# Patient Record
Sex: Male | Born: 1955 | Race: White | Hispanic: No | Marital: Married | Smoking: Former smoker
Health system: Southern US, Community
[De-identification: ages and names within clinical notes are randomized; demographics above are authoritative.]

## PROBLEM LIST (undated history)

## (undated) DIAGNOSIS — C760 Malignant neoplasm of head, face and neck: Secondary | ICD-10-CM

## (undated) DIAGNOSIS — G9520 Unspecified cord compression: Secondary | ICD-10-CM

## (undated) DIAGNOSIS — R1310 Dysphagia, unspecified: Secondary | ICD-10-CM

## (undated) DIAGNOSIS — F1010 Alcohol abuse, uncomplicated: Secondary | ICD-10-CM

## (undated) DIAGNOSIS — J439 Emphysema, unspecified: Secondary | ICD-10-CM

## (undated) DIAGNOSIS — Z87891 Personal history of nicotine dependence: Secondary | ICD-10-CM

## (undated) DIAGNOSIS — E039 Hypothyroidism, unspecified: Secondary | ICD-10-CM

## (undated) DIAGNOSIS — D649 Anemia, unspecified: Secondary | ICD-10-CM

## (undated) DIAGNOSIS — N400 Enlarged prostate without lower urinary tract symptoms: Secondary | ICD-10-CM

## (undated) DIAGNOSIS — G4733 Obstructive sleep apnea (adult) (pediatric): Secondary | ICD-10-CM

## (undated) DIAGNOSIS — N3941 Urge incontinence: Secondary | ICD-10-CM

## (undated) DIAGNOSIS — C7951 Secondary malignant neoplasm of bone: Secondary | ICD-10-CM

## (undated) DIAGNOSIS — C7800 Secondary malignant neoplasm of unspecified lung: Secondary | ICD-10-CM

## (undated) DIAGNOSIS — R451 Restlessness and agitation: Secondary | ICD-10-CM

## (undated) DIAGNOSIS — E871 Hypo-osmolality and hyponatremia: Secondary | ICD-10-CM

## (undated) DIAGNOSIS — I621 Nontraumatic extradural hemorrhage: Secondary | ICD-10-CM

## (undated) DIAGNOSIS — K219 Gastro-esophageal reflux disease without esophagitis: Secondary | ICD-10-CM

## (undated) DIAGNOSIS — I82629 Acute embolism and thrombosis of deep veins of unspecified upper extremity: Secondary | ICD-10-CM

## (undated) DIAGNOSIS — I10 Essential (primary) hypertension: Secondary | ICD-10-CM

## (undated) DIAGNOSIS — H9190 Unspecified hearing loss, unspecified ear: Secondary | ICD-10-CM

## (undated) DIAGNOSIS — Z515 Encounter for palliative care: Secondary | ICD-10-CM

## (undated) HISTORY — DX: Secondary malignant neoplasm of unspecified lung: C78.00

## (undated) HISTORY — DX: Urge incontinence: N39.41

## (undated) HISTORY — DX: Benign prostatic hyperplasia without lower urinary tract symptoms: N40.0

## (undated) HISTORY — DX: Essential (primary) hypertension: I10

## (undated) HISTORY — PX: OTHER SURGICAL HISTORY: SHX169

## (undated) HISTORY — DX: Malignant neoplasm of head, face and neck: C76.0

## (undated) HISTORY — DX: Anemia, unspecified: D64.9

## (undated) HISTORY — DX: Restlessness and agitation: R45.1

## (undated) HISTORY — DX: Hypo-osmolality and hyponatremia: E87.1

## (undated) HISTORY — DX: Personal history of nicotine dependence: Z87.891

## (undated) HISTORY — DX: Gastro-esophageal reflux disease without esophagitis: K21.9

## (undated) HISTORY — DX: Dysphagia, unspecified: R13.10

## (undated) HISTORY — PX: LUNG BIOPSY: SHX232

## (undated) HISTORY — DX: Hypothyroidism, unspecified: E03.9

## (undated) HISTORY — DX: Alcohol abuse, uncomplicated: F10.10

## (undated) HISTORY — DX: Encounter for palliative care: Z51.5

## (undated) HISTORY — DX: Emphysema, unspecified: J43.9

## (undated) HISTORY — DX: Unspecified hearing loss, unspecified ear: H91.90

## (undated) HISTORY — DX: Obstructive sleep apnea (adult) (pediatric): G47.33

---

## 2006-08-19 HISTORY — PX: OTHER SURGICAL HISTORY: SHX169

## 2009-08-19 DIAGNOSIS — C4442 Squamous cell carcinoma of skin of scalp and neck: Secondary | ICD-10-CM

## 2009-08-19 DIAGNOSIS — C760 Malignant neoplasm of head, face and neck: Secondary | ICD-10-CM

## 2009-08-19 HISTORY — DX: Malignant neoplasm of head, face and neck: C76.0

## 2009-08-19 HISTORY — PX: THYROIDECTOMY, PARTIAL: SHX18

## 2009-08-19 HISTORY — PX: MODIFIED RADICAL NECK DISSECTION: SHX2045

## 2009-08-19 HISTORY — DX: Squamous cell carcinoma of skin of scalp and neck: C44.42

## 2011-08-20 DIAGNOSIS — J439 Emphysema, unspecified: Secondary | ICD-10-CM

## 2011-08-20 HISTORY — DX: Emphysema, unspecified: J43.9

## 2012-06-04 ENCOUNTER — Telehealth: Payer: Self-pay | Admitting: Oncology

## 2012-06-04 NOTE — Telephone Encounter (Signed)
S/W pt in re NP appt 10/21 @ 10:30 w/Dr. [name]. Referring-[facility] Dx-Head/Neck Emailed:[email or web address]

## 2012-06-04 NOTE — Telephone Encounter (Signed)
C/D 06/04/12 for appt 06/08/12

## 2012-06-05 ENCOUNTER — Encounter: Payer: Self-pay | Admitting: Oncology

## 2012-06-05 DIAGNOSIS — R1310 Dysphagia, unspecified: Secondary | ICD-10-CM | POA: Insufficient documentation

## 2012-06-05 DIAGNOSIS — H9190 Unspecified hearing loss, unspecified ear: Secondary | ICD-10-CM | POA: Insufficient documentation

## 2012-06-05 DIAGNOSIS — C4442 Squamous cell carcinoma of skin of scalp and neck: Secondary | ICD-10-CM | POA: Insufficient documentation

## 2012-06-05 DIAGNOSIS — C760 Malignant neoplasm of head, face and neck: Secondary | ICD-10-CM | POA: Insufficient documentation

## 2012-06-07 NOTE — Patient Instructions (Addendum)
1.  Metastatic head/neck cancer with lung mets. 2.  Treatments:  Extensive chemo in the past. 3.  Plan:  Chemo holiday to recover from past treatments.  Repeat CT scan in Nov 2013 to reassess disease status. I will see you the day after the scan to discuss what to do. 4.  Primary care physician: I will arrange for to see Dr. Baldo Ash [facility] primary care service to be seen within one month.

## 2012-06-08 ENCOUNTER — Telehealth: Payer: Self-pay | Admitting: Oncology

## 2012-06-08 ENCOUNTER — Other Ambulatory Visit (HOSPITAL_BASED_OUTPATIENT_CLINIC_OR_DEPARTMENT_OTHER): Payer: Federal, State, Local not specified - PPO | Admitting: Lab

## 2012-06-08 ENCOUNTER — Encounter: Payer: Self-pay | Admitting: Oncology

## 2012-06-08 ENCOUNTER — Ambulatory Visit: Payer: Federal, State, Local not specified - PPO

## 2012-06-08 ENCOUNTER — Ambulatory Visit (HOSPITAL_BASED_OUTPATIENT_CLINIC_OR_DEPARTMENT_OTHER): Payer: Federal, State, Local not specified - PPO | Admitting: Oncology

## 2012-06-08 VITALS — BP 137/86 | HR 82 | Temp 97.5°F | Resp 20 | Ht 69.5 in | Wt 157.9 lb

## 2012-06-08 DIAGNOSIS — C760 Malignant neoplasm of head, face and neck: Secondary | ICD-10-CM

## 2012-06-08 DIAGNOSIS — C7800 Secondary malignant neoplasm of unspecified lung: Secondary | ICD-10-CM

## 2012-06-08 DIAGNOSIS — C801 Malignant (primary) neoplasm, unspecified: Secondary | ICD-10-CM

## 2012-06-08 DIAGNOSIS — N400 Enlarged prostate without lower urinary tract symptoms: Secondary | ICD-10-CM

## 2012-06-08 DIAGNOSIS — Z23 Encounter for immunization: Secondary | ICD-10-CM

## 2012-06-08 DIAGNOSIS — F411 Generalized anxiety disorder: Secondary | ICD-10-CM

## 2012-06-08 DIAGNOSIS — H9190 Unspecified hearing loss, unspecified ear: Secondary | ICD-10-CM

## 2012-06-08 DIAGNOSIS — R4702 Dysphasia: Secondary | ICD-10-CM

## 2012-06-08 LAB — COMPREHENSIVE METABOLIC PANEL (CC13)
AST: 18 U/L (ref 5–34)
Alkaline Phosphatase: 48 U/L (ref 40–150)
Glucose: 74 mg/dl (ref 70–99)
Sodium: 129 mEq/L — ABNORMAL LOW (ref 136–145)
Total Bilirubin: 0.7 mg/dL (ref 0.20–1.20)
Total Protein: 7 g/dL (ref 6.4–8.3)

## 2012-06-08 LAB — CBC WITH DIFFERENTIAL/PLATELET
Basophils Absolute: 0 10*3/uL (ref 0.0–0.1)
Eosinophils Absolute: 0.1 10*3/uL (ref 0.0–0.5)
LYMPH%: 9.7 % — ABNORMAL LOW (ref 14.0–49.0)
MCV: 94.9 fL (ref 79.3–98.0)
MONO%: 10.9 % (ref 0.0–14.0)
NEUT#: 2.9 10*3/uL (ref 1.5–6.5)
Platelets: 141 10*3/uL (ref 140–400)
RBC: 3.57 10*6/uL — ABNORMAL LOW (ref 4.20–5.82)

## 2012-06-08 MED ORDER — INFLUENZA VIRUS VACC SPLIT PF IM SUSP
0.5000 mL | Freq: Once | INTRAMUSCULAR | Status: AC
  Administered 2012-06-08: 0.5 mL via INTRAMUSCULAR
  Filled 2012-06-08: qty 0.5

## 2012-06-08 MED ORDER — TAMSULOSIN HCL 0.4 MG PO CAPS: 0.4000 mg | ORAL_CAPSULE | Freq: Every day | ORAL | Status: DC

## 2012-06-08 MED ORDER — SOLIFENACIN SUCCINATE 5 MG PO TABS: 5.0000 mg | ORAL_TABLET | Freq: Every day | ORAL | Status: DC

## 2012-06-08 NOTE — Telephone Encounter (Signed)
Printed and gv pt appt schedule for NOV.Marland Kitchenthe patient stated that Dr. [name] sent information to Dr. Oren Section at [place] [facility] so that he could become established as a pt..Also gv pt their telephone number so they could also call to make an appointment to become established as a pt

## 2012-06-08 NOTE — Progress Notes (Signed)
Checked in new pt with no financial concerns. °

## 2012-06-08 NOTE — Progress Notes (Signed)
[facility]  [phone] [phone]   MEDICAL ONCOLOGY - INITIAL CONSULATION    Referral MD:  Dr. Susanne Borders, M.D.   Reason for Referral: metastatic head/neck cancer.   HPI:  [name] [name] is a 55 year-old man with history of smoking, chewing tobacco, drinking EtOH.  He quit smoking and chewing tobacco.  However, he still drinks.  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  He had neck dissection and adjuvant chemorad.  He developed metastatic disease in Oct 2012 with lung mets per biopsy.  He received carboplatin/5FU/Erbitux with toxicity which was d/c in 12/2011.  He was started on weekly Taxol (3 weeks on, 1 week off).  However, he was admitted to the hospital in the hospital on 04/08/2012 with fall, head laceration, anemia, and hyponatremia.  He was under the care of Dr. Juliette Alcide, in [place] at [facility].  He was in the process of moving to [place] to be with his son.  Dr. Juliette Alcide recommended chemo holiday for him to recover.  He was kindly referred to the Cancer Center to establish care.   [name] [name] presented to the clinic to day for the first time with his wife.  Being off of chemo since August 2013, he has felt better.  However, he lost weight during the admission in August and has not regained the lost weight. He still drinks up to 12 bottles beer a day.  He has mild fatigue; however, he is independent of light activities of daily living.  He spends a lot of time at rest.  He was able to tolerate the long car ride from [place] to [place] a few weeks ago.  He has fibrotic skin change in the left neck since radiation.  He does not restricted range of motion.  He has mild dysphagia with dry foods but drinking fluid resolves the dysphagia temporarily.  He denied palpable node swelling, fever, headache, mucositis, cough, SOB, chest pain, abd pain, bleeding symptoms, back pain, bowel/bladder  incontinence, depression, hallucination.     Past Medical History  Diagnosis Date  . Squamous cell carcinoma of head and neck 2011    Unknown primary. He presented with right sided ear pain. Biopsy in the neck nodes showed squamous cell carcinoma. He underwent neck dissection then chemoradiation. He developed lung nodules in August 2012. He received carboplatin, 5-FU, cetuximab which in October 2012 until may 2013 with multiple dose delays and reductions because of toxicities. He had disease progression in the lungs in may 2013.  Marland Kitchen Hearing loss   . Hypothyroid   . Hypertension   . Gastroesophageal reflux disease   . Obstructive sleep apnea     not using CPAP  . Anemia   . Dysphasia   . BPH (benign prostatic hyperplasia)   . Restlessness and agitation     unclear etiology.  His previous physicians gave him  Risperdal.   :  Past Surgical History  Procedure Date  . Modified radical neck dissection     left side.   . Thyroidectomy, partial   . Lung biopsy   :  Current Outpatient Prescriptions  Medication Sig Dispense Refill  . Albuterol Sulfate (PROAIR HFA IN) Inhale 8.5 g into the lungs as needed.      Marland Kitchen aspirin 81 MG tablet Take 81 mg by mouth daily.      Marland Kitchen escitalopram (LEXAPRO) 20 MG tablet Take 20 mg by mouth daily.      Marland Kitchen  Magnesium 100 MG TABS Take 100 mg by mouth daily.      Marland Kitchen omeprazole (PRILOSEC) 40 MG capsule Take 40 mg by mouth daily.      . ondansetron (ZOFRAN) 8 MG tablet Take 8 mg by mouth every 8 (eight) hours as needed.      . prochlorperazine (COMPAZINE) 10 MG tablet Take 10 mg by mouth every 6 (six) hours as needed.      . risperiDONE (RISPERDAL) 0.25 MG tablet Take 0.25 mg by mouth daily.      . Tamsulosin HCl (FLOMAX) 0.4 MG CAPS Take 1 capsule (0.4 mg total) by mouth daily.  90 capsule  0  . valsartan (DIOVAN) 80 MG tablet Take 80 mg by mouth daily.      . solifenacin (VESICARE) 5 MG tablet Take 1 tablet (5 mg total) by mouth daily.  90 tablet  0   Current  Facility-Administered Medications  Medication Dose Route Frequency Provider Last Rate Last Dose  . influenza  inactive virus vaccine (FLUZONE/FLUARIX) injection 0.5 mL  0.5 mL Intramuscular Once Exie Parody, MD   0.5 mL at 06/08/12 1159     No Known Allergies:  Family History  Problem Relation Age of Onset  . Cancer Father     Melanoma  . Cancer Maternal Grandfather     colon  :  History   Social History  . Marital Status: Married    Spouse Name: N/A    Number of Children: 1  . Years of Education: N/A   Occupational History  .      retired Chiropodist    Social History Main Topics  . Smoking status: Former Smoker -- 0.5 packs/day    Quit date: 08/19/2001  . Smokeless tobacco: Former Neurosurgeon    Quit date: 08/19/2001  . Alcohol Use: No  . Drug Use: No  . Sexually Active:    Other Topics Concern  . Not on file   Social History Narrative  . No narrative on file  :  Pertinent items are noted in HPI.  Exam: ECOG 2.   General:  Thin-appearing man, in no acute distress.  Eyes:  no scleral icterus.  ENT:  There were no oropharyngeal lesions.  Neck was without thyromegaly.  His left neck was fibrotic but without palpable mass.  Lymphatics:  Negative cervical, supraclavicular or axillary adenopathy.  Respiratory: lungs were clear bilaterally without wheezing or crackles.  Cardiovascular:  Regular rate and rhythm, S1/S2, without murmur, rub or gallop.  There was no pedal edema.  GI:  abdomen was soft, flat, nontender, nondistended, without organomegaly.  Muscoloskeletal:  no spinal tenderness of palpation of vertebral spine.  Skin exam was without echymosis, petichae.  Neuro exam was nonfocal.  Patient was able to get on and off exam table without assistance.  Gait was normal.  Patient was alerted and oriented.  Attention was good.   Language was appropriate.  Mood was normal without depression.  Speech was not pressured.  Thought content was not tangential.     Lab Results    Component Value Date   WBC 3.7* 06/08/2012   HGB 11.5* 06/08/2012   HCT 33.9* 06/08/2012   PLT 141 06/08/2012   GLUCOSE 74 06/08/2012   ALT 14 06/08/2012   AST 18 06/08/2012   NA 129* 06/08/2012   K 5.4* 06/08/2012   CL 93* 06/08/2012   CREATININE 0.8 06/08/2012   BUN 12.0 06/08/2012   CO2 29 06/08/2012  Assessment and Plan:   1.  History of smoking and chewing tobacco:  I stressed the importance of stopping this given his cancer.   2.  Current EtOH:  Up to 12 packs/day.  This is empty calories, causing him to not improved his strength, and hyponatremia.  I strongly advised him to slowly wean off to avoid DT and seizure.  3.  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  I advised him to continue chemo holiday until Nov 2013 when we will obtain another restaging scan.  If he has significant disease progression, I may consider starting Carbo/Taxol/Erbitux.  If there is no disease progression, I may recommend continue chemo holiday.  4.  BPH:  He has been out of Tamsulosin and Vesicare for about 2 weeks.  He asked for refill.  I gave him 58-month supply but strongly recommended that he establish primary care.  He asked for recommendation.  He lives in [place].  I referred him to Dr. Sharen Hones from [facility].  5.  Depression/anxiety:  On Lexapro and Risperdal.  6.  Follow up:  After restaging CT scan in Nov 2013.    Thank you for this referral.   The length of time of the face-to-face encounter was 45 minutes. More than 50% of time was spent counseling and coordination of care.

## 2012-06-22 ENCOUNTER — Telehealth: Payer: Self-pay | Admitting: Family Medicine

## 2012-06-23 NOTE — Telephone Encounter (Addendum)
We can place 07/06/2012 at 2pm, 45 min appt plz. Let me know if he needs refills prior.

## 2012-06-23 NOTE — Telephone Encounter (Signed)
I spoke with patient and he scheduled appointment on 07/06/12.  Patient said he has enough medication until the appointment.

## 2012-07-06 ENCOUNTER — Other Ambulatory Visit (HOSPITAL_COMMUNITY): Payer: Federal, State, Local not specified - PPO

## 2012-07-06 ENCOUNTER — Encounter: Payer: Self-pay | Admitting: Family Medicine

## 2012-07-06 ENCOUNTER — Ambulatory Visit (INDEPENDENT_AMBULATORY_CARE_PROVIDER_SITE_OTHER): Payer: Federal, State, Local not specified - PPO | Admitting: Family Medicine

## 2012-07-06 VITALS — BP 156/90 | HR 80 | Temp 98.5°F | Ht 71.0 in | Wt 170.5 lb

## 2012-07-06 DIAGNOSIS — R1310 Dysphagia, unspecified: Secondary | ICD-10-CM

## 2012-07-06 DIAGNOSIS — F1010 Alcohol abuse, uncomplicated: Secondary | ICD-10-CM

## 2012-07-06 DIAGNOSIS — N400 Enlarged prostate without lower urinary tract symptoms: Secondary | ICD-10-CM

## 2012-07-06 DIAGNOSIS — G4733 Obstructive sleep apnea (adult) (pediatric): Secondary | ICD-10-CM

## 2012-07-06 DIAGNOSIS — E039 Hypothyroidism, unspecified: Secondary | ICD-10-CM

## 2012-07-06 DIAGNOSIS — C4442 Squamous cell carcinoma of skin of scalp and neck: Secondary | ICD-10-CM

## 2012-07-06 DIAGNOSIS — I10 Essential (primary) hypertension: Secondary | ICD-10-CM

## 2012-07-06 DIAGNOSIS — K219 Gastro-esophageal reflux disease without esophagitis: Secondary | ICD-10-CM

## 2012-07-06 DIAGNOSIS — C760 Malignant neoplasm of head, face and neck: Secondary | ICD-10-CM

## 2012-07-06 DIAGNOSIS — H9190 Unspecified hearing loss, unspecified ear: Secondary | ICD-10-CM

## 2012-07-06 DIAGNOSIS — Z87891 Personal history of nicotine dependence: Secondary | ICD-10-CM | POA: Insufficient documentation

## 2012-07-06 LAB — BASIC METABOLIC PANEL
BUN: 15 mg/dL (ref 6–23)
CO2: 29 mEq/L (ref 19–32)
Calcium: 9.2 mg/dL (ref 8.4–10.5)
GFR: 101.51 mL/min (ref >60.00)
Glucose, Bld: 95 mg/dL (ref 70–99)
Potassium: 5.3 mEq/L — ABNORMAL HIGH (ref 3.5–5.1)

## 2012-07-06 LAB — MAGNESIUM: Magnesium: 2.1 mg/dL (ref 1.5–2.5)

## 2012-07-06 NOTE — Assessment & Plan Note (Signed)
Previously on levothyroxine daily, this was not refilled.  Will check TSH today to see if continued need. H/o partial thyroidectomy.

## 2012-07-06 NOTE — Assessment & Plan Note (Addendum)
H/o squamous cell cancer of head and neck, seemed in remission. Concern for new lesions in R lung, pending CT scan tomorrow. To follow with Dr. [name] this week. Await records from PCP.

## 2012-07-06 NOTE — Assessment & Plan Note (Signed)
Not using CPAP.  

## 2012-07-06 NOTE — Assessment & Plan Note (Signed)
Controlled on omeprazole, continue. 

## 2012-07-06 NOTE — Patient Instructions (Addendum)
Keep track of blood pressure at home.  If consistently >140/90, let me know. Let's check some blood work today - electrolytes, magnesium, and thyroid function. Good to see you today, call us with questions.

## 2012-07-06 NOTE — Assessment & Plan Note (Signed)
Strongly encouraged slow titration off EtOH.  Pt states will attempt.

## 2012-07-06 NOTE — Assessment & Plan Note (Signed)
On vesicare (h/o urinary incontinence) and flomax

## 2012-07-06 NOTE — Progress Notes (Signed)
Subjective:    Patient ID: [name] [name], male    DOB: 05-04-1956, 56 y.o.   MRN: [id number]  HPI CC: New pt to establish  Moved here September to be closer to grandchildren.  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  Unknown origin.  Completed chemo and radiation therapy 12/2009.  In remission until 04/2011 (nodules in lung found) s/p biopsy positive for squamous cell cancer.  After this had another 46 weeks of chemo.  Last chemo therapy was 03/2012.  Next appointment with Dr. [name] is this Wednesday.  CT scan scheduled for tomorrow.  Dysphagia - tends to cough and aspirate.  More trouble with large bites.  S/p XRT damage.  Trouble with solid foods.  No problems with liquids per patient.  No h/o aspiration PNA.  HTN - well controlled on diovan.  BPH - on flomax and vesicare (h/o urinary incontinence).  Still with intermittent accidents.  Wears pad.  Depression - lexapro has significantly helped.  On risperdal to enhance escitalopram.  Saw psychiatrist in [place], none locally.  Very stable from depression standpoint.  Denies anxiety issues.  EtOH - h/o heavy liquor use.  Wife stopped this.  Now drinking 3-12 beers/day.  No hangover.  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  Preventative: Flu 2013 Tetanus 2009 pneumonia shot 2013  Wt Readings from Last 3 Encounters:  07/06/12 170 lb 8 oz (77.338 kg)  06/08/12 157 lb 14.4 oz (71.623 kg)   Medications and allergies reviewed and updated in chart.  Past histories reviewed and updated if relevant as below. Patient Active Problem List  Diagnosis  . Hearing loss  . Squamous cell carcinoma of head and neck  . Dysphasia  . Hypothyroid  . Hypertension  . Gastroesophageal reflux disease  . Obstructive sleep apnea  . BPH (benign prostatic hyperplasia)   Past  Medical History  Diagnosis Date  . Squamous cell carcinoma of head and neck 2011    Unknown primary. He presented with right sided ear pain. Biopsy in the neck nodes showed squamous cell carcinoma. He underwent neck dissection then chemoradiation. He developed lung nodules in August 2012. He received carboplatin, 5-FU, cetuximab which in October 2012 until may 2013 with multiple dose delays and reductions because of toxicities. He had disease progression in the lungs in may 2013.  Marland Kitchen Hearing loss     wears aides  . Hypothyroid   . Hypertension   . Gastroesophageal reflux disease     well controlled on omeprazole  . Obstructive sleep apnea     not using CPAP  . Anemia   . Dysphasia   . BPH (benign prostatic hyperplasia)   . Restlessness and agitation     unclear etiology.  His previous physicians gave him  Risperdal.   . Urine incontinence    Past Surgical History  Procedure Date  . Modified radical neck dissection 2011    left side.   . Thyroidectomy, partial 2011  . Lung biopsy   . Other surgical history 2008, 2009    L femur rod  . Other surgical history 2008    R radius   History  Substance Use Topics  . Smoking status: Former Smoker -- 0.5 packs/day    Quit date: 08/19/2001  . Smokeless tobacco: Former Neurosurgeon    Quit date: 08/19/2001  . Alcohol Use: Yes  Comment: Regular-beer   Family History  Problem Relation Age of Onset  . Cancer Father     Melanoma  . Cancer Maternal Grandfather     laryngeal  . Hypertension Father   . CAD Neg Hx   . Stroke Neg Hx   . Diabetes Neg Hx    Allergies  Allergen Reactions  . Benadryl (Diphenhydramine Hcl) Anxiety    IV only-oral okay   Current Outpatient Prescriptions on File Prior to Visit  Medication Sig Dispense Refill  . Albuterol Sulfate (PROAIR HFA IN) Inhale 8.5 g into the lungs as needed.      Marland Kitchen aspirin 81 MG tablet Take 81 mg by mouth daily.      Marland Kitchen escitalopram (LEXAPRO) 20 MG tablet Take 20 mg by mouth daily.       Marland Kitchen levothyroxine (SYNTHROID, LEVOTHROID) 50 MCG tablet Take 50 mcg by mouth daily.      Marland Kitchen omeprazole (PRILOSEC) 40 MG capsule Take 40 mg by mouth daily.      . risperiDONE (RISPERDAL) 0.25 MG tablet Take 0.25 mg by mouth daily.      . solifenacin (VESICARE) 5 MG tablet Take 1 tablet (5 mg total) by mouth daily.  90 tablet  0  . Tamsulosin HCl (FLOMAX) 0.4 MG CAPS Take 1 capsule (0.4 mg total) by mouth daily.  90 capsule  0  . valsartan (DIOVAN) 80 MG tablet Take 80 mg by mouth daily.      . ondansetron (ZOFRAN) 8 MG tablet Take 8 mg by mouth every 8 (eight) hours as needed.      . prochlorperazine (COMPAZINE) 10 MG tablet Take 10 mg by mouth every 6 (six) hours as needed.        Review of Systems  Constitutional: Negative for fever, chills, activity change, appetite change, fatigue and unexpected weight change.  HENT: Negative for hearing loss and neck pain.   Eyes: Negative for visual disturbance.  Respiratory: Negative for cough, chest tightness, shortness of breath and wheezing.   Cardiovascular: Negative for chest pain, palpitations and leg swelling.  Gastrointestinal: Negative for nausea, vomiting, abdominal pain, diarrhea, constipation, blood in stool and abdominal distention.  Genitourinary: Negative for hematuria and difficulty urinating.  Musculoskeletal: Negative for myalgias and arthralgias.  Skin: Negative for rash.  Neurological: Negative for dizziness, seizures, syncope and headaches.  Hematological: Bruises/bleeds easily.  Psychiatric/Behavioral: Negative for dysphoric mood. The patient is not nervous/anxious.        Objective:   Physical Exam  Nursing note and vitals reviewed. Constitutional: He is oriented to person, place, and time. He appears well-developed and well-nourished. No distress.       thin  HENT:  Head: Normocephalic and atraumatic.  Right Ear: Tympanic membrane, external ear and ear canal normal. Decreased hearing is noted.  Left Ear: Tympanic  membrane, external ear and ear canal normal. Decreased hearing is noted.  Nose: Nose normal.  Mouth/Throat: Oropharynx is clear and moist. No oropharyngeal exudate.       Some trouble swallowing noted. Stiff/tight L neck from h/o XRT.   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 Hearing aides in place.  Eyes: Conjunctivae normal and EOM are normal. Pupils are equal, round, and reactive to light. No scleral icterus.  Neck: Normal range of motion. Neck supple. No thyromegaly present.  Cardiovascular: Normal rate, regular rhythm, normal heart sounds and intact distal pulses.   No murmur heard. Pulses:      Radial pulses are 2+ on the right side, and 2+ on  the left side.  Pulmonary/Chest: Effort normal and breath sounds normal. No respiratory distress. He has no wheezes. He has no rales.  Abdominal: Soft. Bowel sounds are normal. He exhibits no distension and no mass. There is no tenderness. There is no rebound and no guarding.  Musculoskeletal: Normal range of motion. He exhibits no edema.  Lymphadenopathy:    He has no cervical adenopathy.  Neurological: He is alert and oriented to person, place, and time.       CN grossly intact, station and gait intact  Skin: Skin is warm and dry. No rash noted.  Psychiatric: He has a normal mood and affect. His behavior is normal. Judgment and thought content normal.       Assessment & Plan:

## 2012-07-06 NOTE — Assessment & Plan Note (Signed)
Longstanding.  Wears hearing aides.

## 2012-07-06 NOTE — Assessment & Plan Note (Signed)
Chronic, discussed monitoring bp at home, update me if remaining uncontrolled.

## 2012-07-06 NOTE — Assessment & Plan Note (Signed)
Mild.  Continue to monitor. Wt Readings from Last 3 Encounters:  07/06/12 170 lb 8 oz (77.338 kg)  06/08/12 157 lb 14.4 oz (71.623 kg)

## 2012-07-06 NOTE — Assessment & Plan Note (Signed)
Abstinent since 2003.

## 2012-07-07 ENCOUNTER — Encounter: Payer: Self-pay | Admitting: Family Medicine

## 2012-07-07 ENCOUNTER — Other Ambulatory Visit: Payer: Self-pay | Admitting: Family Medicine

## 2012-07-07 ENCOUNTER — Ambulatory Visit (HOSPITAL_COMMUNITY)
Admission: RE | Admit: 2012-07-07 | Discharge: 2012-07-07 | Disposition: A | Discharge status: Home / Self Care | Payer: Federal, State, Local not specified - PPO | Source: Ambulatory Visit | Attending: Oncology | Admitting: Oncology

## 2012-07-07 DIAGNOSIS — H9190 Unspecified hearing loss, unspecified ear: Secondary | ICD-10-CM | POA: Insufficient documentation

## 2012-07-07 DIAGNOSIS — E039 Hypothyroidism, unspecified: Secondary | ICD-10-CM

## 2012-07-07 DIAGNOSIS — R911 Solitary pulmonary nodule: Secondary | ICD-10-CM | POA: Insufficient documentation

## 2012-07-07 DIAGNOSIS — E871 Hypo-osmolality and hyponatremia: Secondary | ICD-10-CM

## 2012-07-07 DIAGNOSIS — C760 Malignant neoplasm of head, face and neck: Secondary | ICD-10-CM | POA: Insufficient documentation

## 2012-07-07 DIAGNOSIS — Z85820 Personal history of malignant melanoma of skin: Secondary | ICD-10-CM | POA: Insufficient documentation

## 2012-07-07 DIAGNOSIS — R4702 Dysphasia: Secondary | ICD-10-CM

## 2012-07-07 DIAGNOSIS — R4789 Other speech disturbances: Secondary | ICD-10-CM | POA: Insufficient documentation

## 2012-07-07 MED ORDER — IOHEXOL 300 MG/ML  SOLN
100.0000 mL | Freq: Once | INTRAMUSCULAR | Status: AC | PRN
  Administered 2012-07-07: 100 mL via INTRAVENOUS

## 2012-07-07 MED ORDER — LEVOTHYROXINE SODIUM 50 MCG PO TABS: 50.0000 ug | ORAL_TABLET | Freq: Every day | ORAL | Status: DC

## 2012-07-08 ENCOUNTER — Telehealth: Payer: Self-pay | Admitting: Oncology

## 2012-07-08 ENCOUNTER — Ambulatory Visit (HOSPITAL_BASED_OUTPATIENT_CLINIC_OR_DEPARTMENT_OTHER): Payer: Federal, State, Local not specified - PPO | Admitting: Oncology

## 2012-07-08 VITALS — BP 155/84 | HR 87 | Temp 97.0°F | Resp 20 | Ht 71.0 in | Wt 167.4 lb

## 2012-07-08 DIAGNOSIS — C760 Malignant neoplasm of head, face and neck: Secondary | ICD-10-CM

## 2012-07-08 DIAGNOSIS — C7800 Secondary malignant neoplasm of unspecified lung: Secondary | ICD-10-CM

## 2012-07-08 MED ORDER — HEPARIN SOD (PORK) LOCK FLUSH 100 UNIT/ML IV SOLN
500.0000 [IU] | Freq: Once | INTRAVENOUS | Status: AC
  Administered 2012-07-08: 500 [IU] via INTRAVENOUS
  Filled 2012-07-08: qty 5

## 2012-07-08 MED ORDER — SODIUM CHLORIDE 0.9 % IJ SOLN
10.0000 mL | INTRAMUSCULAR | Status: DC | PRN
  Administered 2012-07-08: 10 mL via INTRAVENOUS
  Filled 2012-07-08: qty 10

## 2012-07-08 NOTE — Telephone Encounter (Signed)
gv and printed appt schedule for pt for Jan thru May 2014

## 2012-07-08 NOTE — Progress Notes (Signed)
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   PAST THERAPY: palliative chemo.   CURRENT THERAPY: chemo holiday.   INTERVAL HISTORY: [name] [name] 56 y.o. male returns for regular follow up with his wife.  He reports feeling mildly fatigue.  His wife said that he spends almost all of his awake time in a chair.  He is independent of personal hygiene activities.  However, he does not perform any chores.  He drinks about 12 beers a day.  He has mild to moderate SOB from smoking history.  He still has intermittent cough without hemoptysis, fever, pleurisy.  His appetite is not great; but stable as is his weight per his report.   Patient denies  headache, visual changes, confusion, drenching night sweats, palpable lymph node swelling, mucositis, odynophagia, dysphagia, nausea vomiting, jaundice, gum bleeding, epistaxis, hematemesis, hemoptysis, abdominal pain, abdominal swelling, early satiety, melena, hematochezia, hematuria, skin rash, spontaneous bleeding, joint swelling, joint pain, heat or cold intolerance, bowel bladder incontinence, back pain, focal motor weakness, paresthesia.    Past Medical History  Diagnosis Date  . Squamous cell carcinoma of head and neck 2011    Unknown primary. He presented with right sided ear pain. Biopsy in the neck nodes showed squamous cell carcinoma. He underwent neck dissection then chemoradiation. He developed lung nodules in August 2012. He received carboplatin, 5-FU, cetuximab which in October 2012 until may 2013 with multiple dose delays and reductions because of toxicities. He had disease progression in the lungs in may 2013.  Marland Kitchen Hearing loss     wears aides  . Hypothyroid   . Hypertension   . Gastroesophageal reflux disease     well controlled on omeprazole  . Obstructive sleep apnea     not using CPAP  .  Anemia   . Dysphagia   . BPH (benign prostatic hyperplasia)   . Restlessness and agitation     unclear etiology.  His previous physicians gave him  Risperdal.   . Urine incontinence   . Alcohol abuse   . History of smoking     Past Surgical History  Procedure Date  . Modified radical neck dissection 2011    left side.   . Thyroidectomy, partial 2011  . Lung biopsy   . Other surgical history 2008, 2009    L femur rod  . Other surgical history 2008    R radius    Current Outpatient Prescriptions  Medication Sig Dispense Refill  . Albuterol Sulfate (PROAIR HFA IN) Inhale 8.5 g into the lungs as needed.      Marland Kitchen aspirin 81 MG tablet Take 81 mg by mouth daily.      . Cholecalciferol (VITAMIN D3) 5000 UNITS TABS Take 1 tablet by mouth daily.      Marland Kitchen escitalopram (LEXAPRO) 20 MG tablet Take 20 mg by mouth daily.      Marland Kitchen levothyroxine (SYNTHROID, LEVOTHROID) 50 MCG tablet Take 1 tablet (50 mcg total) by mouth daily.  30 tablet  11  . magnesium gluconate (MAGONATE) 500 MG tablet Take 500 mg by mouth daily.      . NON FORMULARY daily. Flor-essence 2 TBSP      . omeprazole (PRILOSEC) 40 MG capsule Take 40 mg by mouth daily.      . ondansetron (ZOFRAN) 8 MG tablet Take 8 mg by mouth every 8 (eight) hours as  needed.      . prochlorperazine (COMPAZINE) 10 MG tablet Take 10 mg by mouth every 6 (six) hours as needed.      . risperiDONE (RISPERDAL) 0.25 MG tablet Take 0.25 mg by mouth daily.      . solifenacin (VESICARE) 5 MG tablet Take 1 tablet (5 mg total) by mouth daily.  90 tablet  0  . Tamsulosin HCl (FLOMAX) 0.4 MG CAPS Take 1 capsule (0.4 mg total) by mouth daily.  90 capsule  0  . valsartan (DIOVAN) 80 MG tablet Take 80 mg by mouth daily.       Current Facility-Administered Medications  Medication Dose Route Frequency Provider Last Rate Last Dose  . [COMPLETED] heparin lock flush 100 unit/mL  500 Units Intravenous Once Exie Parody, MD   500 Units at 07/08/12 0955  . sodium chloride 0.9 %  injection 10 mL  10 mL Intravenous PRN Exie Parody, MD   10 mL at 07/08/12 0955    ALLERGIES:  is allergic to benadryl.  REVIEW OF SYSTEMS:  The rest of the 14-point review of system was negative.   Filed Vitals:   07/08/12 0908  BP: 155/84  Pulse: 87  Temp: 97 F (36.1 C)  Resp: 20   Wt Readings from Last 3 Encounters:  07/08/12 167 lb 6.4 oz (75.932 kg)  07/06/12 170 lb 8 oz (77.338 kg)  06/08/12 157 lb 14.4 oz (71.623 kg)   ECOG Performance status: 2  PHYSICAL EXAMINATION:   General: Thin-appearing man, in no acute distress. Eyes: no scleral icterus. ENT: There were no oropharyngeal lesions. Neck was without thyromegaly. His left neck was fibrotic but without palpable mass. Lymphatics: Negative cervical, supraclavicular or axillary adenopathy. Respiratory: lungs were clear bilaterally without wheezing or crackles. Cardiovascular: Regular rate and rhythm, S1/S2, without murmur, rub or gallop. There was no pedal edema. GI: abdomen was soft, flat, nontender, nondistended, without organomegaly. Muscoloskeletal: no spinal tenderness of palpation of vertebral spine. Skin exam was without echymosis, petichae. Neuro exam was nonfocal. Patient was able to get on and off exam table without assistance. Gait was normal. Patient was alerted and oriented. Attention was good. Language was appropriate. Mood was normal without depression. Speech was not pressured. Thought content was not tangential.        LABORATORY/RADIOLOGY DATA:  Lab Results  Component Value Date   WBC 3.7* 06/08/2012   HGB 11.5* 06/08/2012   HCT 33.9* 06/08/2012   PLT 141 06/08/2012   GLUCOSE 95 07/06/2012   ALKPHOS 48 06/08/2012   ALT 14 06/08/2012   AST 18 06/08/2012   NA 123* 07/06/2012   K 5.3* 07/06/2012   CL 86* 07/06/2012   CREATININE 0.8 07/06/2012   BUN 15 07/06/2012   CO2 29 07/06/2012    IMAGING:  I personally reviewed the following CT scans and showed the images to the patient and his wife.   Ct  Soft Tissue Neck W Contrast  07/07/2012  *RADIOLOGY REPORT*  Clinical Data: 56 year old male with metastatic head and neck cancer.  History of melanoma.  CT NECK WITH CONTRAST  Technique:  Multidetector CT imaging of the neck was performed with intravenous contrast.  Contrast: OMNIPAQUE IOHEXOL 300 MG/ML  SOLN In conjunction with CT(s) of the chest which is(are) reported separately.  Comparison: None.  Findings: Chest findings are reported separately.  Visualized orbit soft tissues are within normal limits.  Probable dilated perivascular space at the left basal ganglia, otherwise negative visualized brain parenchyma.  Previous left neck dissection.  Left sternocleidomastoid muscle, internal jugular vein and submandibular glands are surgically absent.  Thickening of the platysma on the left and loss of fat planes in the neck also suggestive of previous XRT.  Associated diffuse pharyngeal mucosal thickening.  Left thyroid lobe also surgically absent. The right thyroid lobe is within normal limits.  The larynx is asymmetric.  There may be right vocal cord paralysis, with asymmetric enlargement of the right piriform sinus and laryngeal ventricle.  Trace retropharyngeal effusion.  No discrete pharyngeal or laryngeal mass.  Parapharyngeal and sublingual spaces are within normal limits.  Right submandibular gland and parotid glands are normal aside from post XRT changes.  Loss of soft tissue planes in the left neck, but no discrete left neck mass or lymphadenopathy is identified.  The left carotid artery remains patent.  No right neck lymphadenopathy or mass identified.  Right IJ approach Port-A-Cath.  Mild right maxillary mucous retention cyst.  Minor ethmoid sinus mucosal thickening.  Other Visualized paranasal sinuses and mastoids are clear.  No suspicious osseous lesion in the neck or at the skull base.  IMPRESSION: 1.  Postoperative and post XRT changes to the left neck.  No discrete neck mass or  lymphadenopathy. 2. Suspicion of right vocal cord paralysis. 3.  Chest findings reported separately.   Original Report Authenticated By: Erskine Speed, M.D.    Ct Chest W Contrast  07/07/2012  *RADIOLOGY REPORT*  Clinical Data: Metastatic head/neck cancer, history of melanoma  CT CHEST WITH CONTRAST  Technique:  Multidetector CT imaging of the chest was performed following the standard protocol during bolus administration of intravenous contrast.  Contrast: OMNIPAQUE IOHEXOL 300 MG/ML  SOLN  Comparison: None.  Findings: Multiple bilateral pulmonary nodules, suspicious for metastases, including:  --10 x 13 mm posterior right upper lobe nodule (series 7/image 14) --5 mm right lower lobe nodule (series 7/image 27) --5 x 7 mm right lower lobe nodule (series 7/image 30) --5 x 6 mm right lower lobe nodule (series 7/image 37) --8 x 6 mm left lower lobe nodule (series 7/image 47)  Mild centrilobular emphysematous changes.  Suspected radiation changes in the lung apices.  Calcified granuloma in the left lower lobe (series 7/image 46). No pleural effusion or pneumothorax.  Prior left thyroid resection.  The heart is normal in size.  No pericardial effusion.  Small mediastinal lymph nodes which do not meet pathologic CT size criteria.  No suspicious hilar or axillary lymphadenopathy.  Right chest port.  Visualized upper abdomen is notable for cholelithiasis, without associated inflammatory changes.  Degenerative changes of the thoracic spine.  IMPRESSION: Five pulmonary nodules, measuring up to 13 mm as described above, suspicious for metastases.  Mild centrilobular emphysematous changes.   Original Report Authenticated By: Charline Bills, M.D.        ASSESSMENT AND PLAN:    1. History of smoking and chewing tobacco:he claimed that he is not doing this anymore.  2. Current EtOH: Up to 12 packs/day. He is not willing to give up beer.  3. Hypothyroidism:  On synthroid.  4. BPH: He is onTamsulosin and  Vesicare.  5. Depression/anxiety: On Lexapro and Risperdal.  6. 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 CT chest today compared to 03/2012 report from [facility] showed no significant growth of the lung nodules (largest one 1.3cm not encasing the central airway or vasculature).  Chemo in his situation is palliative and  not curative.  As he had grade 3 side effects with chemo and his previous oncologist recommended chemo holiday.  I agree with this recommendation. In the future, if there is significant growth of his metastatic burden, then chemo may be initiated if hie has reasonable performance status.  Mr. Uemura and his wife agreed with watchful observation. 7.  Follow up:  In 3 months.  Repeat CT scan in 6 months but sooner if concerning symptoms.    The length of time of the face-to-face encounter was 25 minutes. More than 50% of time was spent counseling and coordination of care.

## 2012-07-08 NOTE — Patient Instructions (Addendum)
1.  Diagnosis:  Metastatic head/neck cancer to met to lungs. 2.  Restaging CT:  Showed minimal metastatic burden in the lungs. However, disease is minimal.  If asymptomatic, I would recommend watchful observation due to poor toleration of chemotherapy in the past.  3.  Return to clinic in about 3 months.  Repeat CT in 6 months but sooner if more shortness of breath.

## 2012-07-13 ENCOUNTER — Other Ambulatory Visit (INDEPENDENT_AMBULATORY_CARE_PROVIDER_SITE_OTHER): Payer: Federal, State, Local not specified - PPO

## 2012-07-13 DIAGNOSIS — E039 Hypothyroidism, unspecified: Secondary | ICD-10-CM

## 2012-07-13 DIAGNOSIS — E871 Hypo-osmolality and hyponatremia: Secondary | ICD-10-CM

## 2012-07-13 LAB — BASIC METABOLIC PANEL
BUN: 18 mg/dL (ref 6–23)
CO2: 32 mEq/L (ref 19–32)
Chloride: 86 mEq/L — ABNORMAL LOW (ref 96–112)
Creatinine, Ser: 0.8 mg/dL (ref 0.4–1.5)
Glucose, Bld: 93 mg/dL (ref 70–99)

## 2012-07-19 ENCOUNTER — Other Ambulatory Visit: Payer: Self-pay | Admitting: Family Medicine

## 2012-07-19 DIAGNOSIS — E871 Hypo-osmolality and hyponatremia: Secondary | ICD-10-CM

## 2012-07-19 HISTORY — DX: Hypo-osmolality and hyponatremia: E87.1

## 2012-07-20 ENCOUNTER — Encounter: Payer: Self-pay | Admitting: Family Medicine

## 2012-08-05 ENCOUNTER — Other Ambulatory Visit (INDEPENDENT_AMBULATORY_CARE_PROVIDER_SITE_OTHER): Payer: Federal, State, Local not specified - PPO

## 2012-08-05 DIAGNOSIS — E871 Hypo-osmolality and hyponatremia: Secondary | ICD-10-CM

## 2012-08-05 LAB — BASIC METABOLIC PANEL
BUN: 13 mg/dL (ref 6–23)
CO2: 32 mEq/L (ref 19–32)
Calcium: 9.7 mg/dL (ref 8.4–10.5)
Chloride: 85 mEq/L — ABNORMAL LOW (ref 96–112)
Creatinine, Ser: 0.8 mg/dL (ref 0.4–1.5)

## 2012-08-05 LAB — TSH: TSH: 5.32 u[IU]/mL (ref 0.35–5.50)

## 2012-08-09 ENCOUNTER — Other Ambulatory Visit: Payer: Self-pay | Admitting: Family Medicine

## 2012-08-09 DIAGNOSIS — E039 Hypothyroidism, unspecified: Secondary | ICD-10-CM

## 2012-08-09 MED ORDER — LEVOTHYROXINE SODIUM 50 MCG PO TABS: 50.0000 ug | ORAL_TABLET | Freq: Every day | ORAL | Status: DC

## 2012-08-11 ENCOUNTER — Other Ambulatory Visit: Payer: Self-pay | Admitting: *Deleted

## 2012-08-11 DIAGNOSIS — E039 Hypothyroidism, unspecified: Secondary | ICD-10-CM

## 2012-08-18 ENCOUNTER — Other Ambulatory Visit: Payer: Self-pay | Admitting: Oncology

## 2012-08-20 ENCOUNTER — Ambulatory Visit (HOSPITAL_BASED_OUTPATIENT_CLINIC_OR_DEPARTMENT_OTHER): Payer: Federal, State, Local not specified - PPO

## 2012-08-20 VITALS — BP 153/87 | HR 80 | Temp 98.0°F

## 2012-08-20 DIAGNOSIS — C760 Malignant neoplasm of head, face and neck: Secondary | ICD-10-CM

## 2012-08-20 DIAGNOSIS — Z452 Encounter for adjustment and management of vascular access device: Secondary | ICD-10-CM

## 2012-08-20 MED ORDER — HEPARIN SOD (PORK) LOCK FLUSH 100 UNIT/ML IV SOLN
500.0000 [IU] | Freq: Once | INTRAVENOUS | Status: AC
  Administered 2012-08-20: 500 [IU] via INTRAVENOUS
  Filled 2012-08-20: qty 5

## 2012-08-20 MED ORDER — SODIUM CHLORIDE 0.9 % IJ SOLN
10.0000 mL | INTRAMUSCULAR | Status: DC | PRN
  Administered 2012-08-20: 10 mL via INTRAVENOUS
  Filled 2012-08-20: qty 10

## 2012-09-01 ENCOUNTER — Ambulatory Visit: Payer: Federal, State, Local not specified - PPO | Admitting: Family Medicine

## 2012-09-04 ENCOUNTER — Ambulatory Visit (INDEPENDENT_AMBULATORY_CARE_PROVIDER_SITE_OTHER): Payer: Federal, State, Local not specified - PPO | Admitting: Family Medicine

## 2012-09-04 ENCOUNTER — Encounter: Payer: Self-pay | Admitting: Family Medicine

## 2012-09-04 VITALS — BP 168/94 | HR 80 | Temp 98.1°F | Wt 177.2 lb

## 2012-09-04 DIAGNOSIS — E039 Hypothyroidism, unspecified: Secondary | ICD-10-CM

## 2012-09-04 DIAGNOSIS — E871 Hypo-osmolality and hyponatremia: Secondary | ICD-10-CM

## 2012-09-04 DIAGNOSIS — N3941 Urge incontinence: Secondary | ICD-10-CM

## 2012-09-04 DIAGNOSIS — C760 Malignant neoplasm of head, face and neck: Secondary | ICD-10-CM

## 2012-09-04 DIAGNOSIS — F1010 Alcohol abuse, uncomplicated: Secondary | ICD-10-CM

## 2012-09-04 DIAGNOSIS — C4442 Squamous cell carcinoma of skin of scalp and neck: Secondary | ICD-10-CM

## 2012-09-04 DIAGNOSIS — I10 Essential (primary) hypertension: Secondary | ICD-10-CM

## 2012-09-04 LAB — BASIC METABOLIC PANEL
BUN: 13 mg/dL (ref 6–23)
CO2: 31 mEq/L (ref 19–32)
Calcium: 9.4 mg/dL (ref 8.4–10.5)
Chloride: 87 mEq/L — ABNORMAL LOW (ref 96–112)
Creatinine, Ser: 0.8 mg/dL (ref 0.4–1.5)

## 2012-09-04 MED ORDER — LOSARTAN POTASSIUM 100 MG PO TABS: 100.0000 mg | ORAL_TABLET | Freq: Every day | ORAL | Status: DC

## 2012-09-04 NOTE — Progress Notes (Signed)
  Subjective:    Patient ID: [name] [name], male    DOB: March 14, 1956, 57 y.o.   MRN: [id number]  HPI CC: 2 mo f/u  Seen here to establish care 06/2012.  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 Unknown origin. Completed chemo and radiation therapy 12/2009. In remission until 04/2011 (nodules in lung found) s/p biopsy positive for squamous cell cancer. After this had another 46 weeks of chemo. Last chemo therapy was 03/2012.  Sees Dr. [name].  Next appt with onc is 10/08/2012.  F/u CT scan 10/2012.  Hyponatremia with low ADH level <1 and Uosm 250.  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  Chronically on biotene which helps some.  Drinks 2 glasses of milk and/or orange juice with meals.  Tea in evenings.    Beer - 6 pack/day.  Hypothyroidism - synthroid recently increased.  Tolerating well.  Doesn't feel as cold as prior to changes. Lab Results  Component Value Date   TSH 5.32 08/05/2012    HTN - on diovan long term.  Was on 3 medicines prior to weight loss from cancer/chemo.  Now only on diovan but asks to change med given cost in upcoming insurance formulary change. Lab Results  Component Value Date   CREATININE 0.8 08/05/2012    Urge incontinence - has been on vesicare longterm.  no accidents in past 5 months.  Suffers from dry mouth.  Asks about cheaper alternative 2/2 formulary changes. Wt Readings from Last 3 Encounters:  09/04/12 177 lb 4 oz (80.4 kg)  07/08/12 167 lb 6.4 oz (75.932 kg)  07/06/12 170 lb 8 oz (77.338 kg)    Review of Systems Per HPI    Objective:   Physical Exam  Nursing note and vitals reviewed. Constitutional: He appears well-developed and well-nourished. No distress.  Neck: No thyromegaly present.       Woody consistency of neck, no tenderness  Cardiovascular: Normal rate, regular rhythm, normal heart sounds and intact distal pulses.   No murmur heard. Pulmonary/Chest: Effort normal and  breath sounds normal. No respiratory distress. He has no wheezes. He has no rales.  Psychiatric: He has a normal mood and affect.       Assessment & Plan:

## 2012-09-04 NOTE — Patient Instructions (Addendum)
Try to back off vesicare to see if urinary symptoms controlled with less (every other day dosing or a few times a week).  Call us with an update on how this is working. Back off of alcohol. Blood work today. Change diovan to losartan at 100mg  daily.  New medicine sent to pharmacy. Schedule appointment for wife up front.

## 2012-09-04 NOTE — Assessment & Plan Note (Signed)
Appropriately suppressed SIADH.  Anticipate beer drinker's potomania.  Encouraged cutting back on EtOH.

## 2012-09-04 NOTE — Assessment & Plan Note (Signed)
Has f/u with onc planned. Known primary cancer.  Concerning R lung mets.

## 2012-09-04 NOTE — Assessment & Plan Note (Signed)
Desires change from vesicare however given he endorses significant dry mouth and unsure if med helping sxs, I suggested he try trial of decreased vesicare - try QOD, try to taper off med to see if dry mouth improves. Also discussed decreased EtOH should also help.

## 2012-09-04 NOTE — Assessment & Plan Note (Signed)
Chronic, deteriorated. Requests med changes 2/2 formulary change. Will start losartan 100mg  daily.

## 2012-09-04 NOTE — Assessment & Plan Note (Signed)
Recent increase in levothyroxine - recheck TSH today. daily, one day a week.

## 2012-09-04 NOTE — Assessment & Plan Note (Signed)
Continue to encourage decrease/cessation. discussed hyponatremia and beer use.

## 2012-09-06 ENCOUNTER — Other Ambulatory Visit: Payer: Self-pay | Admitting: Oncology

## 2012-09-07 ENCOUNTER — Encounter: Payer: Self-pay | Admitting: *Deleted

## 2012-09-21 ENCOUNTER — Other Ambulatory Visit: Payer: Self-pay | Admitting: Oncology

## 2012-09-23 ENCOUNTER — Other Ambulatory Visit: Payer: Self-pay | Admitting: Oncology

## 2012-10-06 ENCOUNTER — Encounter: Payer: Self-pay | Admitting: Oncology

## 2012-10-07 ENCOUNTER — Telehealth: Payer: Self-pay | Admitting: Oncology

## 2012-10-07 NOTE — Telephone Encounter (Signed)
Added flush to 2/20 lb/fu. appt r/s from 2/13. S/w pt he is aware and has new time for 8:45am 2/20.

## 2012-10-08 ENCOUNTER — Ambulatory Visit (HOSPITAL_BASED_OUTPATIENT_CLINIC_OR_DEPARTMENT_OTHER): Payer: Federal, State, Local not specified - PPO | Admitting: Oncology

## 2012-10-08 ENCOUNTER — Encounter: Payer: Self-pay | Admitting: Oncology

## 2012-10-08 ENCOUNTER — Other Ambulatory Visit: Payer: Federal, State, Local not specified - PPO | Admitting: Lab

## 2012-10-08 ENCOUNTER — Ambulatory Visit: Payer: Federal, State, Local not specified - PPO

## 2012-10-08 VITALS — BP 166/92 | HR 79 | Temp 97.4°F | Resp 18 | Ht 71.0 in | Wt 186.4 lb

## 2012-10-08 DIAGNOSIS — C7800 Secondary malignant neoplasm of unspecified lung: Secondary | ICD-10-CM

## 2012-10-08 LAB — CBC WITH DIFFERENTIAL/PLATELET
BASO%: 0.8 % (ref 0.0–2.0)
EOS%: 2.6 % (ref 0.0–7.0)
HCT: 31.9 % — ABNORMAL LOW (ref 38.4–49.9)
LYMPH%: 8.6 % — ABNORMAL LOW (ref 14.0–49.0)
MCH: 31.6 pg (ref 27.2–33.4)
MCHC: 33.8 g/dL (ref 32.0–36.0)
MCV: 93.6 fL (ref 79.3–98.0)
MONO%: 13.5 % (ref 0.0–14.0)
NEUT%: 74.5 % (ref 39.0–75.0)
Platelets: 146 10*3/uL (ref 140–400)

## 2012-10-08 LAB — COMPREHENSIVE METABOLIC PANEL (CC13)
ALT: 13 U/L (ref 0–55)
AST: 15 U/L (ref 5–34)
Alkaline Phosphatase: 46 U/L (ref 40–150)
CO2: 28 mEq/L (ref 22–29)
Creatinine: 0.8 mg/dL (ref 0.7–1.3)
Total Bilirubin: 0.41 mg/dL (ref 0.20–1.20)

## 2012-10-08 MED ORDER — HEPARIN SOD (PORK) LOCK FLUSH 100 UNIT/ML IV SOLN
500.0000 [IU] | Freq: Once | INTRAVENOUS | Status: AC
  Administered 2012-10-08: 500 [IU] via INTRAVENOUS
  Filled 2012-10-08: qty 5

## 2012-10-08 MED ORDER — SODIUM CHLORIDE 0.9 % IJ SOLN
10.0000 mL | INTRAMUSCULAR | Status: DC | PRN
  Administered 2012-10-08: 10 mL via INTRAVENOUS
  Filled 2012-10-08: qty 10

## 2012-10-08 NOTE — Progress Notes (Signed)
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   PAST THERAPY: palliative chemo.   CURRENT THERAPY: chemo holiday.   INTERVAL HISTORY: [name] [name] 57 y.o. male returns for regular follow up with his wife.  He reports feeling mildly fatigue.  His wife said that he spends almost all of his awake time in a chair.  He is independent of personal hygiene activities.  However, he does not perform any chores.  He drinks about 12 beers a day.  He has mild to moderate SOB from smoking history.  He still has intermittent cough without hemoptysis, fever, pleurisy. Appetite is good; wife states he eats pizza all day. Weight is up by almost 20 lbs since November 2013.   Patient denies  headache, visual changes, confusion, drenching night sweats, palpable lymph node swelling, mucositis, odynophagia, dysphagia, nausea vomiting, jaundice, gum bleeding, epistaxis, hematemesis, hemoptysis, abdominal pain, abdominal swelling, early satiety, melena, hematochezia, hematuria, skin rash, spontaneous bleeding, joint swelling, joint pain, heat or cold intolerance, bowel bladder incontinence, back pain, focal motor weakness, paresthesia.    Past Medical History  Diagnosis Date  . Squamous cell carcinoma of head and neck 2011    Unknown primary. He presented with right sided ear pain. Biopsy in the neck nodes showed squamous cell carcinoma. He underwent neck dissection then chemoradiation. He developed lung nodules in August 2012. He received carboplatin, 5-FU, cetuximab which in October 2012 until may 2013 with multiple dose delays and reductions because of toxicities. He had disease progression in the lungs in may 2013.  Marland Kitchen Hearing loss     wears aides  . Hypothyroid   . Hypertension   . Gastroesophageal reflux disease     well controlled on omeprazole  . Obstructive sleep  apnea     not using CPAP  . Anemia   . Dysphagia   . BPH (benign prostatic hyperplasia)   . Restlessness and agitation     unclear etiology.  His previous physicians gave him  Risperdal.   . Urge incontinence   . Alcohol abuse   . History of smoking   . Emphysema 2013    mild centrilobular per CT    Past Surgical History  Procedure Laterality Date  . Modified radical neck dissection  2011    left side.   . Thyroidectomy, partial  2011  . Lung biopsy    . Other surgical history  2008, 2009    L femur rod  . Other surgical history  2008    R radius    Current Outpatient Prescriptions  Medication Sig Dispense Refill  . Albuterol Sulfate (PROAIR HFA IN) Inhale 8.5 g into the lungs as needed.      Marland Kitchen aspirin 81 MG tablet Take 81 mg by mouth daily.      . Cholecalciferol (VITAMIN D3) 5000 UNITS TABS Take 1 tablet by mouth daily.      Marland Kitchen escitalopram (LEXAPRO) 20 MG tablet Take 20 mg by mouth daily.      Marland Kitchen levothyroxine (SYNTHROID, LEVOTHROID) 50 MCG tablet Take 1 tablet (50 mcg total) by mouth daily. (take 2 tablets once weekly)  40 tablet  11  . losartan (COZAAR) 100 MG tablet Take 1 tablet (100 mg total) by mouth daily.  30 tablet  3  . magnesium gluconate (MAGONATE) 500 MG tablet Take 500 mg by mouth daily.      Marland Kitchen  NON FORMULARY daily. Flor-essence 2 TBSP      . omeprazole (PRILOSEC) 40 MG capsule Take 40 mg by mouth daily.      . ondansetron (ZOFRAN) 8 MG tablet Take 8 mg by mouth every 8 (eight) hours as needed.      . prochlorperazine (COMPAZINE) 10 MG tablet Take 10 mg by mouth every 6 (six) hours as needed.      . risperiDONE (RISPERDAL) 0.25 MG tablet Take 0.25 mg by mouth daily.      . Tamsulosin HCl (FLOMAX) 0.4 MG CAPS TAKE 1 CAPSULE BY MOUTH ONCE DAILY  90 capsule  0   No current facility-administered medications for this visit.    ALLERGIES:  is allergic to benadryl.  REVIEW OF SYSTEMS:  The rest of the 14-point review of system was negative.   Filed Vitals:    10/08/12 0929  BP: 166/92  Pulse: 79  Temp: 97.4 F (36.3 C)  Resp: 18   Wt Readings from Last 3 Encounters:  10/08/12 186 lb 6.4 oz (84.55 kg)  09/04/12 177 lb 4 oz (80.4 kg)  07/08/12 167 lb 6.4 oz (75.932 kg)   ECOG Performance status: 2  PHYSICAL EXAMINATION:   General: Thin-appearing man, in no acute distress. Eyes: no scleral icterus. ENT: There were no oropharyngeal lesions. Neck was without thyromegaly. His left neck was fibrotic but without palpable mass. Lymphatics: Negative cervical, supraclavicular or axillary adenopathy. Respiratory: lungs were clear bilaterally without wheezing or crackles. Cardiovascular: Regular rate and rhythm, S1/S2, without murmur, rub or gallop. There was no pedal edema. GI: abdomen was soft, flat, nontender, nondistended, without organomegaly. Muscoloskeletal: no spinal tenderness of palpation of vertebral spine. Skin exam was without echymosis, petichae. Neuro exam was nonfocal. Patient was able to get on and off exam table without assistance. Gait was normal. Patient was alerted and oriented. Attention was good. Language was appropriate. Mood was normal without depression. Speech was not pressured. Thought content was not tangential.    LABORATORY/RADIOLOGY DATA:  Lab Results  Component Value Date   WBC 4.4 10/08/2012   HGB 10.8* 10/08/2012   HCT 31.9* 10/08/2012   PLT 146 10/08/2012   GLUCOSE 90 10/08/2012   ALKPHOS 46 10/08/2012   ALT 13 10/08/2012   AST 15 10/08/2012   NA 125* 10/08/2012   K 4.5 10/08/2012   CL 89* 10/08/2012   CREATININE 0.8 10/08/2012   BUN 10.5 10/08/2012   CO2 28 10/08/2012    ASSESSMENT AND PLAN:    1. History of smoking and chewing tobacco:he claimed that he is not doing this anymore.  2. Current EtOH: Up to 12 packs/day. He is not willing to give up beer.  3. Hypothyroidism:  On synthroid.  4. BPH: He is onTamsulosin.  5. Depression/anxiety: On Lexapro and Risperdal.  6. Metastatic squamous cell carcinoma of unknown  head primary with met to lungs: Last chemo with Taxol was in 03/2012. CT chest from 06/2012 was compared to 03/2012 report from [place] and showed no significant growth of the lung nodules (largest one 1.3cm not encasing the central airway or vasculature).  Chemo in his situation is palliative and not curative.  As he had grade 3 side effects with chemo and his previous oncologist recommended chemo holiday.  I agree with this recommendation. In the future, if there is significant growth of his metastatic burden, then chemo may be initiated if he has reasonable performance status.  Mr. Daywalt and his wife agreed with watchful observation. 7.  Follow  up:  In 3 months with a CT scan prior to the visit.    The length of time of the face-to-face encounter was 15 minutes. More than 50% of time was spent counseling and coordination of care.

## 2012-10-20 ENCOUNTER — Other Ambulatory Visit: Payer: Self-pay | Admitting: *Deleted

## 2012-10-20 DIAGNOSIS — C760 Malignant neoplasm of head, face and neck: Secondary | ICD-10-CM

## 2012-10-20 DIAGNOSIS — R4702 Dysphasia: Secondary | ICD-10-CM

## 2012-10-20 MED ORDER — OMEPRAZOLE 40 MG PO CPDR: 40.0000 mg | DELAYED_RELEASE_CAPSULE | Freq: Every day | ORAL | Status: DC

## 2012-11-12 ENCOUNTER — Ambulatory Visit (HOSPITAL_BASED_OUTPATIENT_CLINIC_OR_DEPARTMENT_OTHER): Payer: Federal, State, Local not specified - PPO

## 2012-11-12 VITALS — BP 177/89 | HR 91 | Temp 97.9°F

## 2012-11-12 DIAGNOSIS — C760 Malignant neoplasm of head, face and neck: Secondary | ICD-10-CM

## 2012-11-12 DIAGNOSIS — IMO0002 Reserved for concepts with insufficient information to code with codable children: Secondary | ICD-10-CM

## 2012-11-12 DIAGNOSIS — Z452 Encounter for adjustment and management of vascular access device: Secondary | ICD-10-CM

## 2012-11-12 MED ORDER — HEPARIN SOD (PORK) LOCK FLUSH 100 UNIT/ML IV SOLN
500.0000 [IU] | Freq: Once | INTRAVENOUS | Status: AC
  Administered 2012-11-12: 500 [IU] via INTRAVENOUS
  Filled 2012-11-12: qty 5

## 2012-11-12 MED ORDER — SODIUM CHLORIDE 0.9 % IJ SOLN
10.0000 mL | INTRAMUSCULAR | Status: DC | PRN
  Administered 2012-11-12: 10 mL via INTRAVENOUS
  Filled 2012-11-12: qty 10

## 2012-12-03 ENCOUNTER — Encounter: Payer: Self-pay | Admitting: Family Medicine

## 2012-12-03 ENCOUNTER — Ambulatory Visit (INDEPENDENT_AMBULATORY_CARE_PROVIDER_SITE_OTHER): Payer: Federal, State, Local not specified - PPO | Admitting: Family Medicine

## 2012-12-03 VITALS — BP 136/82 | HR 84 | Temp 98.1°F | Wt 194.2 lb

## 2012-12-03 DIAGNOSIS — C4442 Squamous cell carcinoma of skin of scalp and neck: Secondary | ICD-10-CM

## 2012-12-03 DIAGNOSIS — S29012A Strain of muscle and tendon of back wall of thorax, initial encounter: Secondary | ICD-10-CM

## 2012-12-03 DIAGNOSIS — S239XXA Sprain of unspecified parts of thorax, initial encounter: Secondary | ICD-10-CM

## 2012-12-03 DIAGNOSIS — F1010 Alcohol abuse, uncomplicated: Secondary | ICD-10-CM

## 2012-12-03 DIAGNOSIS — E871 Hypo-osmolality and hyponatremia: Secondary | ICD-10-CM

## 2012-12-03 DIAGNOSIS — I10 Essential (primary) hypertension: Secondary | ICD-10-CM

## 2012-12-03 DIAGNOSIS — E039 Hypothyroidism, unspecified: Secondary | ICD-10-CM

## 2012-12-03 DIAGNOSIS — C760 Malignant neoplasm of head, face and neck: Secondary | ICD-10-CM

## 2012-12-03 LAB — BASIC METABOLIC PANEL
BUN: 11 mg/dL (ref 6–23)
CO2: 29 mEq/L (ref 19–32)
GFR: 94.75 mL/min (ref >60.00)
Glucose, Bld: 81 mg/dL (ref 70–99)
Potassium: 5.1 mEq/L (ref 3.5–5.1)
Sodium: 120 mEq/L — CL (ref 135–145)

## 2012-12-03 MED ORDER — CYCLOBENZAPRINE HCL 5 MG PO TABS: 5.0000 mg | ORAL_TABLET | Freq: Two times a day (BID) | ORAL | Status: DC | PRN

## 2012-12-03 NOTE — Patient Instructions (Signed)
blood work today. I think you have rhomboid muscle strain - treat with flexeril (may make you sleepy), ice/heat and do stretching exercises provided. Good to see you today, call us with questions. Return in 6 months for follow up.

## 2012-12-03 NOTE — Assessment & Plan Note (Signed)
Chronic, recheck TSH.

## 2012-12-03 NOTE — Assessment & Plan Note (Signed)
Recheck today. 

## 2012-12-03 NOTE — Assessment & Plan Note (Signed)
Treat with muscle relaxant, stretching exercises from [facility] pt advisor, and ice/heat. Update [place] if sxs persist. Pt agrees with plan.

## 2012-12-03 NOTE — Assessment & Plan Note (Signed)
Chronic, stable.  Continue losartan 100 mg daily 

## 2012-12-03 NOTE — Assessment & Plan Note (Signed)
States has decreased amt of alcohol intake. Check Na today.

## 2012-12-03 NOTE — Assessment & Plan Note (Signed)
Followed by onc.

## 2012-12-03 NOTE — Progress Notes (Signed)
Subjective:    Patient ID: [name] [name], male    DOB: [birth year], 57 y.o.   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 Unknown origin. Completed chemo and radiation therapy 12/2009. In remission until 04/2011 (nodules in lung found) s/p biopsy positive for squamous cell cancer. After this had another 46 weeks of chemo. Last chemo therapy was 03/2012. Sees Dr. [name]. Next appt with onc is 01/05/2013. F/u CT scan scheduled prior.  Plan is continued chemo holiday. Weight increasing. Wt Readings from Last 3 Encounters:  12/03/12 194 lb 4 oz (88.111 kg)  10/08/12 186 lb 6.4 oz (84.55 kg)  09/04/12 177 lb 4 oz (80.4 kg)    Hyponatremia with low ADH level <1 and Uosm 250. 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 Chronically on biotene which helps some. Drinks 2 glasses of milk and/or orange juice with meals. Tea in evenings. ?beer drinker's potomania. Beer - 6 pack/day. Does not want to cut back.  Tells me he's actually drinking "a couple six packs a week".  Hypothyroidism - synthroid recently increased. Tolerating well. Doesn't feel as cold as prior to changes.  HTN - last visit diovan changed to losartan 100mg  daily.  Back pain - 1 wk h/o sharp burning pain below shoulder blade, feels like" muscle that's pulled".  Denies inciting injury/trauma.  Has taken excedrin and aleve which helps. Denies radiation, tingling, numbness or weakness.  Trouble sleeping at night.  Was working in yard several weeks ago, with chain saw  Prior on risperdal for restlessness, ran out and states doing well off this medicine.  Past Medical History  Diagnosis Date  . Squamous cell carcinoma of head and neck 2011    Unknown primary. He presented with right sided ear pain. Biopsy in the neck nodes showed squamous cell carcinoma. He underwent neck dissection then chemoradiation. He developed lung nodules in August 2012.  He received carboplatin, 5-FU, cetuximab which in October 2012 until may 2013 with multiple dose delays and reductions because of toxicities. He had disease progression in the lungs in may 2013.  Marland Kitchen Hearing loss     wears aides  . Hypothyroid   . Hypertension   . Gastroesophageal reflux disease     well controlled on omeprazole  . Obstructive sleep apnea     not using CPAP  . Anemia   . Dysphagia   . BPH (benign prostatic hyperplasia)   . Restlessness and agitation     unclear etiology.  His previous physicians gave him  Risperdal.   . Urge incontinence   . Alcohol abuse   . History of smoking   . Emphysema 2013    mild centrilobular per CT     Review of Systems Per HPI    Objective:   Physical Exam  Nursing note and vitals reviewed. Constitutional: He appears well-developed and well-nourished. No distress.  HENT:  Mouth/Throat: Oropharynx is clear and moist. No oropharyngeal exudate.  Neck: Carotid bruit is not present. No thyromegaly present.  Woody consistency of neck, no tenderness  Cardiovascular: Normal rate, regular rhythm, normal heart sounds and intact distal pulses.   No murmur heard. Pulmonary/Chest: Effort normal and breath sounds normal. No respiratory distress. He has no wheezes. He has no rales.  Musculoskeletal: He exhibits no edema.  Midline thoracic spine tenderness as well as tenderness at right rhomboids and right paraspinous mm FROM at shoulders.  Skin: Skin is warm and  dry. No rash noted.  Psychiatric: He has a normal mood and affect.       Assessment & Plan:

## 2012-12-07 ENCOUNTER — Other Ambulatory Visit: Payer: Self-pay | Admitting: Family Medicine

## 2012-12-07 DIAGNOSIS — E039 Hypothyroidism, unspecified: Secondary | ICD-10-CM

## 2012-12-17 DIAGNOSIS — C7951 Secondary malignant neoplasm of bone: Secondary | ICD-10-CM

## 2012-12-17 HISTORY — DX: Secondary malignant neoplasm of bone: C79.51

## 2012-12-24 ENCOUNTER — Other Ambulatory Visit: Payer: Self-pay | Admitting: Oncology

## 2012-12-24 ENCOUNTER — Ambulatory Visit (HOSPITAL_BASED_OUTPATIENT_CLINIC_OR_DEPARTMENT_OTHER): Payer: Federal, State, Local not specified - PPO

## 2012-12-24 VITALS — BP 147/91 | HR 86 | Temp 96.9°F

## 2012-12-24 DIAGNOSIS — Z452 Encounter for adjustment and management of vascular access device: Secondary | ICD-10-CM

## 2012-12-24 DIAGNOSIS — C760 Malignant neoplasm of head, face and neck: Secondary | ICD-10-CM

## 2012-12-24 MED ORDER — HEPARIN SOD (PORK) LOCK FLUSH 100 UNIT/ML IV SOLN
500.0000 [IU] | Freq: Once | INTRAVENOUS | Status: AC
  Administered 2012-12-24: 500 [IU] via INTRAVENOUS
  Filled 2012-12-24: qty 5

## 2012-12-24 MED ORDER — SODIUM CHLORIDE 0.9 % IJ SOLN
10.0000 mL | INTRAMUSCULAR | Status: DC | PRN
  Administered 2012-12-24: 10 mL via INTRAVENOUS
  Filled 2012-12-24: qty 10

## 2012-12-24 NOTE — Patient Instructions (Signed)
Call MD for problems or concerns 

## 2012-12-31 ENCOUNTER — Other Ambulatory Visit: Payer: Self-pay | Admitting: Family Medicine

## 2013-01-01 ENCOUNTER — Other Ambulatory Visit: Payer: Self-pay | Admitting: Family Medicine

## 2013-01-01 NOTE — Telephone Encounter (Signed)
OK to refill

## 2013-01-05 ENCOUNTER — Ambulatory Visit (HOSPITAL_COMMUNITY)
Admission: RE | Admit: 2013-01-05 | Discharge: 2013-01-05 | Disposition: A | Discharge status: Home / Self Care | Payer: Federal, State, Local not specified - PPO | Source: Ambulatory Visit | Attending: Oncology | Admitting: Oncology

## 2013-01-05 ENCOUNTER — Encounter (HOSPITAL_COMMUNITY): Payer: Self-pay

## 2013-01-05 DIAGNOSIS — Z9221 Personal history of antineoplastic chemotherapy: Secondary | ICD-10-CM | POA: Insufficient documentation

## 2013-01-05 DIAGNOSIS — R222 Localized swelling, mass and lump, trunk: Secondary | ICD-10-CM | POA: Insufficient documentation

## 2013-01-05 DIAGNOSIS — R1310 Dysphagia, unspecified: Secondary | ICD-10-CM | POA: Insufficient documentation

## 2013-01-05 DIAGNOSIS — M47814 Spondylosis without myelopathy or radiculopathy, thoracic region: Secondary | ICD-10-CM | POA: Insufficient documentation

## 2013-01-05 DIAGNOSIS — Z923 Personal history of irradiation: Secondary | ICD-10-CM | POA: Insufficient documentation

## 2013-01-05 DIAGNOSIS — C801 Malignant (primary) neoplasm, unspecified: Secondary | ICD-10-CM | POA: Insufficient documentation

## 2013-01-05 DIAGNOSIS — C760 Malignant neoplasm of head, face and neck: Secondary | ICD-10-CM | POA: Insufficient documentation

## 2013-01-05 DIAGNOSIS — C7800 Secondary malignant neoplasm of unspecified lung: Secondary | ICD-10-CM

## 2013-01-05 DIAGNOSIS — R918 Other nonspecific abnormal finding of lung field: Secondary | ICD-10-CM | POA: Insufficient documentation

## 2013-01-05 MED ORDER — IOHEXOL 300 MG/ML  SOLN
80.0000 mL | Freq: Once | INTRAMUSCULAR | Status: AC | PRN
  Administered 2013-01-05: 80 mL via INTRAVENOUS

## 2013-01-05 NOTE — Patient Instructions (Signed)
1.  Diagnosis:  Metastatic head and neck cancer.  2.  Status:  Has been on chemo holiday.  There are signs of disease progression at this time. 3.  Treatment options:  * Aggressive:  Radiation to the tracheal mass and then resuming chemo when that is finished.   * Conservative:  Radiation alone for palliative purposes; then repeat CT scan.  When there are more disease in the lungs, we can then resume chemo.

## 2013-01-06 ENCOUNTER — Encounter: Payer: Self-pay | Admitting: Oncology

## 2013-01-06 ENCOUNTER — Telehealth: Payer: Self-pay | Admitting: *Deleted

## 2013-01-06 ENCOUNTER — Inpatient Hospital Stay: Payer: Self-pay | Admitting: Internal Medicine

## 2013-01-06 ENCOUNTER — Other Ambulatory Visit (HOSPITAL_BASED_OUTPATIENT_CLINIC_OR_DEPARTMENT_OTHER): Payer: Federal, State, Local not specified - PPO | Admitting: Lab

## 2013-01-06 ENCOUNTER — Telehealth: Payer: Self-pay | Admitting: Oncology

## 2013-01-06 ENCOUNTER — Ambulatory Visit (HOSPITAL_BASED_OUTPATIENT_CLINIC_OR_DEPARTMENT_OTHER): Payer: Federal, State, Local not specified - PPO | Admitting: Oncology

## 2013-01-06 ENCOUNTER — Telehealth: Payer: Self-pay | Admitting: Family Medicine

## 2013-01-06 VITALS — BP 151/74 | HR 84 | Temp 97.8°F | Resp 18 | Ht 71.0 in | Wt 187.5 lb

## 2013-01-06 DIAGNOSIS — C7802 Secondary malignant neoplasm of left lung: Secondary | ICD-10-CM

## 2013-01-06 DIAGNOSIS — C760 Malignant neoplasm of head, face and neck: Secondary | ICD-10-CM

## 2013-01-06 DIAGNOSIS — E871 Hypo-osmolality and hyponatremia: Secondary | ICD-10-CM

## 2013-01-06 DIAGNOSIS — D539 Nutritional anemia, unspecified: Secondary | ICD-10-CM

## 2013-01-06 DIAGNOSIS — C7800 Secondary malignant neoplasm of unspecified lung: Secondary | ICD-10-CM

## 2013-01-06 DIAGNOSIS — D649 Anemia, unspecified: Secondary | ICD-10-CM

## 2013-01-06 LAB — CBC WITH DIFFERENTIAL/PLATELET
Basophil #: 0.1 10*3/uL (ref 0.0–0.1)
Basophils Absolute: 0 10*3/uL (ref 0.0–0.1)
EOS%: 0.2 % (ref 0.0–7.0)
Eosinophil %: 0.4 %
HCT: 26 % — ABNORMAL LOW (ref 38.4–49.9)
HCT: 22.6 % — ABNORMAL LOW (ref 40.0–52.0)
HGB: 8.9 g/dL — ABNORMAL LOW (ref 13.0–17.1)
HGB: 7.5 g/dL — ABNORMAL LOW (ref 13.0–18.0)
MCH: 30.7 pg (ref 27.2–33.4)
MCH: 29.7 pg (ref 26.0–34.0)
MCV: 89.8 fL (ref 79.3–98.0)
MONO%: 12.9 % (ref 0.0–14.0)
Monocyte #: 0.9 x10 3/mm (ref 0.2–1.0)
Monocyte %: 14.7 %
NEUT%: 84 % — ABNORMAL HIGH (ref 39.0–75.0)
Neutrophil #: 4.8 10*3/uL (ref 1.4–6.5)
Neutrophil %: 79.3 %
Platelets: 237 10*3/uL (ref 140–400)
RBC: 2.54 10*6/uL — ABNORMAL LOW (ref 4.40–5.90)
RDW: 12.6 % (ref 11.5–14.5)

## 2013-01-06 LAB — COMPREHENSIVE METABOLIC PANEL
BUN: 13 mg/dL (ref 7–18)
Co2: 27 mmol/L (ref 21–32)
EGFR (African American): >60
Osmolality: 236 (ref 275–301)
SGPT (ALT): 14 U/L (ref 12–78)
Sodium: 117 mmol/L — CL (ref 136–145)

## 2013-01-06 LAB — BASIC METABOLIC PANEL
BUN: 13 mg/dL (ref 7–18)
Chloride: 89 mmol/L — ABNORMAL LOW (ref 98–107)
Co2: 26 mmol/L (ref 21–32)
Creatinine: 0.77 mg/dL (ref 0.60–1.30)
EGFR (Non-African Amer.): >60
Glucose: 80 mg/dL (ref 65–99)
Osmolality: 243 (ref 275–301)
Sodium: 121 mmol/L — ABNORMAL LOW (ref 136–145)

## 2013-01-06 LAB — URINALYSIS, COMPLETE
Blood: NEGATIVE
Nitrite: NEGATIVE
Protein: NEGATIVE
RBC,UR: <1 /HPF (ref 0–5)
WBC UR: <1 /HPF (ref 0–5)

## 2013-01-06 LAB — FERRITIN: Ferritin (ARMC): 176 ng/mL (ref 8–388)

## 2013-01-06 LAB — COMPREHENSIVE METABOLIC PANEL (CC13)
AST: 20 U/L (ref 5–34)
Alkaline Phosphatase: 90 U/L (ref 40–150)
BUN: 12.2 mg/dL (ref 7.0–26.0)
Calcium: 9.3 mg/dL (ref 8.4–10.4)
Chloride: 82 mEq/L — ABNORMAL LOW (ref 98–107)
Creatinine: 1.1 mg/dL (ref 0.7–1.3)

## 2013-01-06 LAB — OSMOLALITY: Osmolality: 247 mOsm/kg — CL (ref 275–295)

## 2013-01-06 LAB — IRON AND TIBC
Iron Saturation: 19 %
Unbound Iron-Bind.Cap.: 206 ug/dL

## 2013-01-06 LAB — MAGNESIUM: Magnesium: 1.8 mg/dL

## 2013-01-06 LAB — PHOSPHORUS: Phosphorus: 3.3 mg/dL (ref 2.5–4.9)

## 2013-01-06 LAB — ETHANOL: Ethanol %: <0.003 % (ref 0.000–0.080)

## 2013-01-06 LAB — FOLATE: Folic Acid: 11.2 ng/mL (ref 3.1–100.0)

## 2013-01-06 NOTE — Telephone Encounter (Signed)
Faxed this note and latest lab results to [facility] as instructed.

## 2013-01-06 NOTE — Telephone Encounter (Signed)
lmonvm for pt/wife re appt for 5/28 @ 10:30am w/Dr. Kathrynn Running. Wife called back after leaving and stated they changed their mind and would like to have the radonc consult instead of [place]. Wife was aware that I would call back w/appt.

## 2013-01-06 NOTE — Telephone Encounter (Signed)
Patient Information:  Caller Name: [name] [name]  Phone: [phone]  Patient: [name] [name], [name] [name]  Gender: Male  DOB: [birth year]  Age: 57 Years  PCP: [name] [name] [facility])  Office Follow Up:  Does the office need to follow up with this patient?: Yes  Instructions For The Office: PLEASE CALL PT/WIFE BACK ASAP (PREFERABLY DR. G) AND URGE PT TO GO TO HOSPITAL FOR NA LEVELS AND ANEMIA. PT GRUDGINGLY STATES HE WILL GO IF HE CAN GO TO [place]. ONCOLOGIST CALLED AND CONSULTED WITH DR. Reece Agar FROM THIS AM'S LABS (01/06/13) PER WIFE (NOT NOTED IN EPIC).  RN Note:  PLEASE CALL PT/WIFE BACK ASAP (PREFERABLY DR. G) AND URGE PT TO GO TO HOSPITAL FOR NA LEVELS AND ANEMIA. PT GRUDGINGLY STATES HE WILL GO IF HE CAN GO TO [place]. ONCOLOGIST CALLED AND CONSULTED WITH DR. Reece Agar FROM THIS AM'S LABS (01/06/13) PER WIFE (NOT NOTED IN EPIC).  Symptoms  Reason For Call & Symptoms: Wife calling stating pt's anemia. Dx by oncologist this am, 01/06/13. Na results are 16. Wife states Dr. Reece Agar  called and told pt to report to [facility] ASAP. Pt refuses to go and wife is panicked. Pt states he will go to hospital if he can go to [place].  Reviewed Health History In EMR: N/A  Reviewed Medications In EMR: N/A  Reviewed Allergies In EMR: N/A  Reviewed Surgeries / Procedures: N/A  Date of Onset of Symptoms: 01/06/2013  Guideline(s) Used:  No Protocol Available - Sick Adult  Disposition Per Guideline:   Discuss with PCP and Callback by Nurse within 1 Hour  Reason For Disposition Reached:   Nursing judgment  Advice Given:  N/A  Patient Will Follow Care Advice:  YES

## 2013-01-06 NOTE — Telephone Encounter (Signed)
Re: critical hyponatremia value on blood work today. I spoke with Dr. [name] oncologist who saw patient this morning  h/o chronic hyponatremia thought 2/2 EtOH abuse (ADH <1 in 07/2012) However, recent Na 116 - in setting of increased confusion, dizziness, weakness per wife. I agreed that pt will need ER evaluation and possible hospitalization for stabilization. plz fax this note as well as latest blood work to [facility] ER - only ER pt agrees to go to .

## 2013-01-06 NOTE — Telephone Encounter (Signed)
Per Dr Sharen Hones instruction;called Liliana Cline and advised pt can go to [facility] ED; Dr Sharen Hones will make a note and fax this CAN message and recent labs to [facility] ED fax # [phone]. Margie advised and will take pt to [facility] ED now.

## 2013-01-06 NOTE — Telephone Encounter (Signed)
Per patient's wife [name] [name], [name] [name] is refusing to be admitted for low sodium levels.  Tried to call home number and both mobile numbers.  Messages left for wife and sent her a MyChart response.  Unable to reach [name] [name]'s mobile number.  After firstring, a beep was heard and disconnected.

## 2013-01-06 NOTE — Progress Notes (Signed)
[facility]  [phone] [phone]   OFFICE PROGRESS NOTE   Cc:  Eustaquio Boyden, MD  DIAGNOSIS: metastatic head/neck cancer with met to lungs.   PAST THERAPY: palliative chemo.   CURRENT THERAPY: chemo holiday.   INTERVAL HISTORY: [name] [name] 57 y.o. male returns for regular follow up with his wife.  He only eats about 2 slides of pizza everyday.  He does not want to eat anything else that his wife cooks.  He drinks at least about 9-12 beers/day. He has mild dizziness and being forgetful.   His left neck is thick from past treatment. However, he denied pain.  He denied dysphagia, odynophagia, SOB, chest pain.  He noticed that he has been clearing his throat but he denied SOB, productive cough, hemoptysis.  He denied syncope, seizure, focal motor weakness. He spends most of his awake time at rest.  He did ride a Therapist, sports a few weeks ago but need to stop several times to rest.  The rest of the 14-point review of system was negative.    Past Medical History  Diagnosis Date  . Squamous cell carcinoma of head and neck 2011    Unknown primary. He presented with right sided ear pain. Biopsy in the neck nodes showed squamous cell carcinoma. He underwent neck dissection then chemoradiation. He developed lung nodules in August 2012. He received carboplatin, 5-FU, cetuximab which in October 2012 until may 2013 with multiple dose delays and reductions because of toxicities. He had disease progression in the lungs in may 2013.  Marland Kitchen Hearing loss     wears aides  . Hypothyroid   . Hypertension   . Gastroesophageal reflux disease     well controlled on omeprazole  . Obstructive sleep apnea     not using CPAP  . Anemia   . Dysphagia   . BPH (benign prostatic hyperplasia)   . Restlessness and agitation     unclear etiology.  His previous physicians gave him  Risperdal.   . Urge incontinence   . Alcohol abuse   . History of smoking   . Emphysema 2013      mild centrilobular per CT  . Lung metastases 01/06/2013    Past Surgical History  Procedure Laterality Date  . Modified radical neck dissection  2011    left side.   . Thyroidectomy, partial  2011  . Lung biopsy    . Other surgical history  2008, 2009    L femur rod  . Other surgical history  2008    R radius    Current Outpatient Prescriptions  Medication Sig Dispense Refill  . Albuterol Sulfate (PROAIR HFA IN) Inhale 8.5 g into the lungs as needed.      Marland Kitchen aspirin 81 MG tablet Take 81 mg by mouth daily.      . Cholecalciferol (VITAMIN D3) 5000 UNITS TABS Take 1 tablet by mouth daily.      Marland Kitchen escitalopram (LEXAPRO) 20 MG tablet TAKE 1 TABLET BY MOUTH EVERY DAY  90 tablet  3  . levothyroxine (SYNTHROID, LEVOTHROID) 50 MCG tablet Take 1 tablet (50 mcg total) by mouth daily. (take 2 tablets twice weekly)  40 tablet  11  . losartan (COZAAR) 100 MG tablet TAKE 1 TABLET BY MOUTH EVERY DAY  30 tablet  3  . magnesium gluconate (MAGONATE) 500 MG tablet Take 500 mg by mouth daily.      . NON FORMULARY daily. Flor-essence 2 TBSP      .  omeprazole (PRILOSEC) 40 MG capsule Take 1 capsule (40 mg total) by mouth daily.  90 capsule  1  . ondansetron (ZOFRAN) 8 MG tablet Take 8 mg by mouth every 8 (eight) hours as needed.      . prochlorperazine (COMPAZINE) 10 MG tablet Take 10 mg by mouth every 6 (six) hours as needed.      . risperiDONE (RISPERDAL) 0.25 MG tablet TAKE 1 TABLET BY MOUTH EVERY DAY  90 tablet  3  . Tamsulosin HCl (FLOMAX) 0.4 MG CAPS TAKE 1 CAPSULE BY MOUTH ONCE DAILY  90 capsule  0   No current facility-administered medications for this visit.    ALLERGIES:  is allergic to benadryl.  REVIEW OF SYSTEMS:  The rest of the 14-point review of system was negative.   Filed Vitals:   01/06/13 0908  BP: 151/74  Pulse: 84  Temp: 97.8 F (36.6 C)  Resp: 18   Wt Readings from Last 3 Encounters:  01/06/13 187 lb 8 oz (85.049 kg)  12/03/12 194 lb 4 oz (88.111 kg)  10/08/12 186  lb 6.4 oz (84.55 kg)   ECOG Performance status: 2  PHYSICAL EXAMINATION:   General: Thin-appearing man, in no acute distress. Eyes: no scleral icterus. ENT: There were no oropharyngeal lesions. Neck was without thyromegaly. His left neck was fibrotic but without palpable mass. Lymphatics: Negative cervical, supraclavicular or axillary adenopathy. Respiratory: lungs were clear bilaterally without wheezing or crackles. Cardiovascular: Regular rate and rhythm, S1/S2, without murmur, rub or gallop. There was no pedal edema. GI: abdomen was soft, flat, nontender, nondistended, without organomegaly. Muscoloskeletal: no spinal tenderness of palpation of vertebral spine. Skin exam was without echymosis, petichae. Neuro exam was nonfocal. Patient was able to get on and off exam table without assistance. Gait was normal. Patient was alert and oriented. Attention was good. Language was appropriate. Mood was normal without depression. Speech was not pressured. Thought content was not tangential.        LABORATORY/RADIOLOGY DATA:  Lab Results  Component Value Date   WBC 6.9 01/06/2013   HGB 8.9* 01/06/2013   HCT 26.0* 01/06/2013   PLT 237 01/06/2013   GLUCOSE 97 01/06/2013   ALKPHOS 90 01/06/2013   ALT 10 01/06/2013   AST 20 01/06/2013   NA 116 Repeated and Verified* 01/06/2013   K 5.2 Repeated and Verified* 01/06/2013   CL 82 Repeated and Verified* 01/06/2013   CREATININE 1.1 01/06/2013   BUN 12.2 01/06/2013   CO2 25 01/06/2013    IMAGING:  I personally reviewed the following CT scans and showed the images to the patient and his wife.   Comparison: 07/07/12  Findings: There is no pleural effusion identified. Multi focal  pulmonary nodules are identified. Right upper lobe partially  cavitary nodule measures 1.2 cm, image 14/series 5. This is  compared with an 1.3 cm previously.  Nodule within superior segment of the right lower lobe measures 4  mm, image 27/series 5. This is compared with 5 mm  previously.  Right lower lobe nodule measures 5 mm, image 37/series 5.  Previously this measured the same. New right lower lobe nodule is  identified measuring 5 mm, image 42/series 5. Within the left  lower lobe there is a stable 8 mm nodule, image 42/series 5. New  left upper lobe nodule measures 7 mm, image 22/series 5. Also new  is a subpleural nodule on the left upper lobe measuring 8 mm, image  28/series 5.  Heart size is normal. No pericardial  effusion. At the thoracic  inlet there is a tumor which is extending into the chest wall and  along the left internal mammary lymph node chain. This measures  3.3 x 3.9 x 3.4 cm.  Limited imaging through the upper abdomen shows no acute findings.  There is multilevel spondylosis noted within the thoracic spine.  IMPRESSION:  1. Interval progression of disease. There is a soft tissue  attenuating mass at the thoracic inlet which is invading the chest  wall and internal mammary lymph node chain. This is new from  previous exam.  2. Multiple bilateral pulmonary nodules. Previously demonstrated  nodules are not significantly changed from previous exam. There  are new nodules however within the right lower lobe and left upper  lobe which are concerning for progression of disease.   ASSESSMENT AND PLAN:    1. History of smoking and chewing tobacco:he claimed that he is not doing this anymore.  2. Current EtOH: Up to 12 packs/day. He is not willing to give up.  3. Hypothyroidism:  On synthroid.  4. BPH: He is onTamsulosin and Vesicare.  5. Depression/anxiety: On Lexapro and Risperdal.  6. 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 CT chest today showed disease progression.   - The most concerning area was the left thoracic inlet.  This can erode and cause problem with his airway down the line.  Given his co-morbidities, and other lesions elsewhere, I do not think that  resection is a good idea.  I referred him to Rad Onc for evaluation for palliative XRT.  If after palliative XRT and he still has disease progression in the area, then we can consider salvage resection. - He does have slight progression of disease in the lung parenchyma; however, the nodules are still subcm.  Chemo at this time for these lesions will not be palliative and more chance of side effects given his co-morbidities. 7.  Severe hyponatremia:  Most likely due to extensive EtOH.  However, cannot rule out SIADH.  I personally discussed the case with his PCP Dr. Sharen Hones; however, patient declined to be admitted. I advised his wife to bring him to ED if he becomes more confused or have seizure.   8.  Anemia:  Most likely anemia of chronic inflammation.  However, given his EtOH, I need to rule out GI bleed. I requested iron panel, hemolysis work up, myeloma.    9.  Follow up:  In 3 months with CT chest the day prior.   I informed [name] [name] and his wife that I am leaving the service.  [facility] will [facility] for him to see a new provider when he comes back.    The length of time of the face-to-face encounter was 25 minutes. More than 50% of time was spent counseling and coordination of care.

## 2013-01-06 NOTE — Telephone Encounter (Signed)
Ms. Sawaya returned my call.  Reports they are at [facility] at this time.  Soon he is going to have a CT scan of his Head.  Will notify Dr. [name].

## 2013-01-06 NOTE — Telephone Encounter (Signed)
gv pt appt schedule for June thru August. Per pt/wife they would like to have the radonc consult/tx @ [place]. Referral to Tiffany - HIM to make referral to [place]. Pt/wife aware. Pt also aware central will call him re ct.

## 2013-01-07 ENCOUNTER — Telehealth: Payer: Self-pay | Admitting: Oncology

## 2013-01-07 LAB — BASIC METABOLIC PANEL
Anion Gap: 6 — ABNORMAL LOW (ref 7–16)
Anion Gap: 6 — ABNORMAL LOW (ref 7–16)
BUN: 11 mg/dL (ref 7–18)
BUN: 10 mg/dL (ref 7–18)
BUN: 11 mg/dL (ref 7–18)
Calcium, Total: 8.3 mg/dL — ABNORMAL LOW (ref 8.5–10.1)
Chloride: 91 mmol/L — ABNORMAL LOW (ref 98–107)
Chloride: 92 mmol/L — ABNORMAL LOW (ref 98–107)
Co2: 26 mmol/L (ref 21–32)
Co2: 27 mmol/L (ref 21–32)
Co2: 27 mmol/L (ref 21–32)
EGFR (Non-African Amer.): >60
EGFR (Non-African Amer.): >60
EGFR (Non-African Amer.): >60
Glucose: 93 mg/dL (ref 65–99)
Glucose: 91 mg/dL (ref 65–99)
Glucose: 87 mg/dL (ref 65–99)
Osmolality: 246 (ref 275–301)
Osmolality: 250 (ref 275–301)
Potassium: 4.1 mmol/L (ref 3.5–5.1)
Potassium: 4.3 mmol/L (ref 3.5–5.1)
Potassium: 4.5 mmol/L (ref 3.5–5.1)
Sodium: 123 mmol/L — ABNORMAL LOW (ref 136–145)
Sodium: 123 mmol/L — ABNORMAL LOW (ref 136–145)

## 2013-01-07 LAB — MAGNESIUM: Magnesium: 1.7 mg/dL — ABNORMAL LOW

## 2013-01-07 LAB — CBC WITH DIFFERENTIAL/PLATELET
Eosinophil #: 0 10*3/uL (ref 0.0–0.7)
Lymphocyte %: 3.3 %
Monocyte #: 0.7 x10 3/mm (ref 0.2–1.0)
Neutrophil #: 5.1 10*3/uL (ref 1.4–6.5)
Neutrophil %: 84.2 %
RBC: 2.82 10*6/uL — ABNORMAL LOW (ref 4.40–5.90)

## 2013-01-07 LAB — TSH: Thyroid Stimulating Horm: 5.42 u[IU]/mL — ABNORMAL HIGH

## 2013-01-07 NOTE — Telephone Encounter (Signed)
Added lb appt for 5/28 @ 10am. Same day as radonc appt. lmonvm for pt re add on lb appt. Lb added per 5/22 pof#2.

## 2013-01-08 ENCOUNTER — Other Ambulatory Visit: Payer: Self-pay | Admitting: Oncology

## 2013-01-08 LAB — BASIC METABOLIC PANEL
Anion Gap: 5 — ABNORMAL LOW (ref 7–16)
BUN: 9 mg/dL (ref 7–18)
BUN: 8 mg/dL (ref 7–18)
Chloride: 88 mmol/L — ABNORMAL LOW (ref 98–107)
Co2: 28 mmol/L (ref 21–32)
Creatinine: 0.92 mg/dL (ref 0.60–1.30)
EGFR (African American): >60
EGFR (African American): >60
EGFR (Non-African Amer.): >60
EGFR (Non-African Amer.): >60
Glucose: 105 mg/dL — ABNORMAL HIGH (ref 65–99)
Osmolality: 249 (ref 275–301)
Osmolality: 243 (ref 275–301)
Sodium: 124 mmol/L — ABNORMAL LOW (ref 136–145)

## 2013-01-08 LAB — MAGNESIUM: Magnesium: 2 mg/dL

## 2013-01-12 NOTE — Telephone Encounter (Signed)
Message left for patient to return my call.  

## 2013-01-12 NOTE — Telephone Encounter (Signed)
Can we call for f/u of recent [facility] ER trip for hyponatremia? Looks like pt has f/u with rad onc tomorrow.

## 2013-01-13 ENCOUNTER — Ambulatory Visit: Payer: Federal, State, Local not specified - PPO | Admitting: Radiation Oncology

## 2013-01-13 ENCOUNTER — Other Ambulatory Visit: Payer: Federal, State, Local not specified - PPO | Admitting: Lab

## 2013-01-13 ENCOUNTER — Telehealth: Payer: Self-pay | Admitting: Oncology

## 2013-01-13 ENCOUNTER — Ambulatory Visit: Payer: Federal, State, Local not specified - PPO

## 2013-01-13 NOTE — Telephone Encounter (Signed)
S/w Clydie Braun today re the status of moving pt's radonc appt. Pt's wife called back last week to r/s appt due to pt going out of town. appt was moved to 6/2. I spoke w/Karen to see if she made contact w/pt to confirm 6/2 appt as wife mentioned a date of 6/5 in her message and I was not sure if they would be back by 6/2. Per Clydie Braun she would try to  Reach pt. Clydie Braun lmonvm later today stating appt was moved to 6/10 and changed to Dr. Basilio Cairo since she goes to her head/neck conference. Per Clydie Braun she left a detailed message on home vm re new appt and mobile number did not seem to be working.

## 2013-01-14 NOTE — Telephone Encounter (Signed)
Message left for patient to return my call.  

## 2013-01-15 ENCOUNTER — Encounter: Payer: Self-pay | Admitting: Oncology

## 2013-01-17 DIAGNOSIS — Z515 Encounter for palliative care: Secondary | ICD-10-CM

## 2013-01-17 HISTORY — DX: Encounter for palliative care: Z51.5

## 2013-01-17 HISTORY — PX: THORACIC SPINE SURGERY: SHX802

## 2013-01-18 ENCOUNTER — Ambulatory Visit: Payer: Federal, State, Local not specified - PPO | Admitting: Radiation Oncology

## 2013-01-18 ENCOUNTER — Telehealth: Payer: Self-pay | Admitting: Oncology

## 2013-01-18 ENCOUNTER — Ambulatory Visit: Payer: Federal, State, Local not specified - PPO

## 2013-01-18 MED ORDER — TAMSULOSIN HCL 0.4 MG PO CAPS: 0.4000 mg | ORAL_CAPSULE | Freq: Every day | ORAL | Status: DC

## 2013-01-18 NOTE — Telephone Encounter (Signed)
Patient's wife called and said patient is doing much better after hospital discharge. He is feeling better and was able to go out of town last week, which is why I didn't hear back from them until today.

## 2013-01-18 NOTE — Addendum Note (Signed)
Addended by: Josph Macho A on: 01/18/2013 03:22 PM   Modules accepted: Orders

## 2013-01-18 NOTE — Telephone Encounter (Signed)
Noted.  Will await D/C summary

## 2013-01-19 ENCOUNTER — Encounter: Payer: Self-pay | Admitting: *Deleted

## 2013-01-19 ENCOUNTER — Other Ambulatory Visit: Payer: Self-pay | Admitting: Oncology

## 2013-01-19 DIAGNOSIS — C760 Malignant neoplasm of head, face and neck: Secondary | ICD-10-CM

## 2013-01-19 DIAGNOSIS — C7800 Secondary malignant neoplasm of unspecified lung: Secondary | ICD-10-CM

## 2013-01-19 NOTE — Progress Notes (Signed)
Thoracic Location of Tumor / Histology:  Lung metastases from Squamous Cell of unknown primary of the neck.  He developed lung nodules in August 2012. Now there is a soft tissue attenuating mass at the thoracic inlet which is invading the chest wall and internal mammary lymph node chain. This is new from previous exam. Also has multiple bilateral pulmonary nodules   Patient presented No symptoms.  Lung nodules seen CT Scan from 5/33/14.  He intially presented with right sided ear pain in 2012. Biopsy in the neck nodes showed squamous cell carcinoma. Biopsies of (if applicable) revealed:  Tobacco/Marijuana/Snuff/ETOH use: Former Smoker: stopped 2003  Past/Anticipated interventions by cardiothoracic surgery, if any:  He underwent neck dissection then chemoradiation  Past/Anticipated interventions by medical oncology, if any: He received carboplatin, 5-FU, cetuximab which in October 2012 until may 2013 with multiple dose delays and reductions because of toxicities.  Taxol in 03/2012  He had disease progression in the lungs in may 2013.- routine follow-up scans  Radiation Therapy: Radiation at [facility] in [facility] 2011 under Dr.McAllister and Dr.Halyard    Signs/Symptoms  Weight changes, if any: No  Respiratory complaints, if any: No  Hemoptysis, if any: No  Pain issues, if any:  No chest pain has back discomfort and new onset lower extremity weakness since Monday 01/18/13.Fell at home due to this weakness and is using 2 canes.Wife has to assist with getting up.  SAFETY ISSUES:  Prior radiation? Yes head and neck radiation at [facility] in [facility]  Pacemaker/ICD?No  Possible current pregnancy? No  Is the patient on methotrexate? No  Current Complaints / other details:Main concern today is to get radiation for lung nodules and figure out what has caused lower extremity weakness.

## 2013-01-20 ENCOUNTER — Encounter: Payer: Self-pay | Admitting: Oncology

## 2013-01-20 ENCOUNTER — Encounter (HOSPITAL_COMMUNITY): Payer: Self-pay

## 2013-01-20 ENCOUNTER — Ambulatory Visit
Admission: RE | Admit: 2013-01-20 | Discharge: 2013-01-20 | Disposition: A | Discharge status: Home / Self Care | Payer: Federal, State, Local not specified - PPO | Source: Ambulatory Visit | Attending: Radiation Oncology | Admitting: Radiation Oncology

## 2013-01-20 ENCOUNTER — Inpatient Hospital Stay (HOSPITAL_COMMUNITY)
Admission: AD | Admit: 2013-01-20 | Discharge: 2013-01-30 | DRG: 531 | Disposition: A | Discharge status: Hospice | Payer: Federal, State, Local not specified - PPO | Source: Ambulatory Visit | Attending: Oncology | Admitting: Oncology

## 2013-01-20 ENCOUNTER — Encounter: Payer: Self-pay | Admitting: Radiation Oncology

## 2013-01-20 ENCOUNTER — Other Ambulatory Visit: Payer: Self-pay | Admitting: Oncology

## 2013-01-20 ENCOUNTER — Other Ambulatory Visit: Payer: Self-pay | Admitting: *Deleted

## 2013-01-20 ENCOUNTER — Other Ambulatory Visit (HOSPITAL_BASED_OUTPATIENT_CLINIC_OR_DEPARTMENT_OTHER): Payer: Federal, State, Local not specified - PPO | Admitting: Lab

## 2013-01-20 ENCOUNTER — Ambulatory Visit (HOSPITAL_COMMUNITY): Payer: Federal, State, Local not specified - PPO

## 2013-01-20 ENCOUNTER — Ambulatory Visit (HOSPITAL_BASED_OUTPATIENT_CLINIC_OR_DEPARTMENT_OTHER): Payer: Federal, State, Local not specified - PPO | Admitting: Oncology

## 2013-01-20 ENCOUNTER — Inpatient Hospital Stay (HOSPITAL_COMMUNITY): Payer: Federal, State, Local not specified - PPO

## 2013-01-20 VITALS — BP 156/80 | HR 85 | Temp 98.4°F | Resp 20 | Wt 184.4 lb

## 2013-01-20 VITALS — BP 143/91 | HR 78 | Temp 97.7°F | Resp 18 | Ht 71.0 in | Wt 185.0 lb

## 2013-01-20 DIAGNOSIS — F1020 Alcohol dependence, uncomplicated: Secondary | ICD-10-CM

## 2013-01-20 DIAGNOSIS — D492 Neoplasm of unspecified behavior of bone, soft tissue, and skin: Secondary | ICD-10-CM

## 2013-01-20 DIAGNOSIS — D62 Acute posthemorrhagic anemia: Secondary | ICD-10-CM | POA: Diagnosis not present

## 2013-01-20 DIAGNOSIS — I10 Essential (primary) hypertension: Secondary | ICD-10-CM

## 2013-01-20 DIAGNOSIS — G4733 Obstructive sleep apnea (adult) (pediatric): Secondary | ICD-10-CM | POA: Diagnosis present

## 2013-01-20 DIAGNOSIS — D6481 Anemia due to antineoplastic chemotherapy: Secondary | ICD-10-CM

## 2013-01-20 DIAGNOSIS — C7952 Secondary malignant neoplasm of bone marrow: Secondary | ICD-10-CM | POA: Diagnosis present

## 2013-01-20 DIAGNOSIS — C801 Malignant (primary) neoplasm, unspecified: Secondary | ICD-10-CM

## 2013-01-20 DIAGNOSIS — E871 Hypo-osmolality and hyponatremia: Secondary | ICD-10-CM

## 2013-01-20 DIAGNOSIS — D649 Anemia, unspecified: Secondary | ICD-10-CM

## 2013-01-20 DIAGNOSIS — I82629 Acute embolism and thrombosis of deep veins of unspecified upper extremity: Secondary | ICD-10-CM | POA: Diagnosis not present

## 2013-01-20 DIAGNOSIS — K219 Gastro-esophageal reflux disease without esophagitis: Secondary | ICD-10-CM | POA: Diagnosis present

## 2013-01-20 DIAGNOSIS — J9600 Acute respiratory failure, unspecified whether with hypoxia or hypercapnia: Secondary | ICD-10-CM

## 2013-01-20 DIAGNOSIS — E43 Unspecified severe protein-calorie malnutrition: Secondary | ICD-10-CM | POA: Diagnosis present

## 2013-01-20 DIAGNOSIS — E039 Hypothyroidism, unspecified: Secondary | ICD-10-CM

## 2013-01-20 DIAGNOSIS — T40605A Adverse effect of unspecified narcotics, initial encounter: Secondary | ICD-10-CM | POA: Diagnosis not present

## 2013-01-20 DIAGNOSIS — C7800 Secondary malignant neoplasm of unspecified lung: Secondary | ICD-10-CM

## 2013-01-20 DIAGNOSIS — R29898 Other symptoms and signs involving the musculoskeletal system: Secondary | ICD-10-CM

## 2013-01-20 DIAGNOSIS — F3289 Other specified depressive episodes: Secondary | ICD-10-CM

## 2013-01-20 DIAGNOSIS — D630 Anemia in neoplastic disease: Secondary | ICD-10-CM | POA: Diagnosis not present

## 2013-01-20 DIAGNOSIS — C760 Malignant neoplasm of head, face and neck: Secondary | ICD-10-CM

## 2013-01-20 DIAGNOSIS — IMO0002 Reserved for concepts with insufficient information to code with codable children: Secondary | ICD-10-CM | POA: Diagnosis not present

## 2013-01-20 DIAGNOSIS — M549 Dorsalgia, unspecified: Secondary | ICD-10-CM

## 2013-01-20 DIAGNOSIS — C771 Secondary and unspecified malignant neoplasm of intrathoracic lymph nodes: Secondary | ICD-10-CM

## 2013-01-20 DIAGNOSIS — G9520 Unspecified cord compression: Secondary | ICD-10-CM

## 2013-01-20 DIAGNOSIS — I621 Nontraumatic extradural hemorrhage: Secondary | ICD-10-CM

## 2013-01-20 DIAGNOSIS — R339 Retention of urine, unspecified: Secondary | ICD-10-CM | POA: Diagnosis not present

## 2013-01-20 DIAGNOSIS — N3941 Urge incontinence: Secondary | ICD-10-CM | POA: Diagnosis present

## 2013-01-20 DIAGNOSIS — I498 Other specified cardiac arrhythmias: Secondary | ICD-10-CM | POA: Diagnosis not present

## 2013-01-20 DIAGNOSIS — C7951 Secondary malignant neoplasm of bone: Secondary | ICD-10-CM | POA: Diagnosis present

## 2013-01-20 DIAGNOSIS — Z66 Do not resuscitate: Secondary | ICD-10-CM | POA: Diagnosis not present

## 2013-01-20 DIAGNOSIS — F329 Major depressive disorder, single episode, unspecified: Secondary | ICD-10-CM | POA: Diagnosis present

## 2013-01-20 DIAGNOSIS — F411 Generalized anxiety disorder: Secondary | ICD-10-CM

## 2013-01-20 DIAGNOSIS — R1310 Dysphagia, unspecified: Secondary | ICD-10-CM

## 2013-01-20 DIAGNOSIS — I82622 Acute embolism and thrombosis of deep veins of left upper extremity: Secondary | ICD-10-CM

## 2013-01-20 DIAGNOSIS — N400 Enlarged prostate without lower urinary tract symptoms: Secondary | ICD-10-CM

## 2013-01-20 DIAGNOSIS — F1010 Alcohol abuse, uncomplicated: Secondary | ICD-10-CM

## 2013-01-20 DIAGNOSIS — H9190 Unspecified hearing loss, unspecified ear: Secondary | ICD-10-CM | POA: Diagnosis present

## 2013-01-20 DIAGNOSIS — Z87891 Personal history of nicotine dependence: Secondary | ICD-10-CM

## 2013-01-20 DIAGNOSIS — D539 Nutritional anemia, unspecified: Secondary | ICD-10-CM

## 2013-01-20 DIAGNOSIS — J438 Other emphysema: Secondary | ICD-10-CM | POA: Diagnosis present

## 2013-01-20 DIAGNOSIS — C140 Malignant neoplasm of pharynx, unspecified: Secondary | ICD-10-CM | POA: Diagnosis present

## 2013-01-20 DIAGNOSIS — G8220 Paraplegia, unspecified: Secondary | ICD-10-CM | POA: Diagnosis present

## 2013-01-20 DIAGNOSIS — R0902 Hypoxemia: Secondary | ICD-10-CM | POA: Diagnosis not present

## 2013-01-20 DIAGNOSIS — C4442 Squamous cell carcinoma of skin of scalp and neck: Secondary | ICD-10-CM

## 2013-01-20 DIAGNOSIS — E878 Other disorders of electrolyte and fluid balance, not elsewhere classified: Secondary | ICD-10-CM | POA: Diagnosis not present

## 2013-01-20 DIAGNOSIS — Y838 Other surgical procedures as the cause of abnormal reaction of the patient, or of later complication, without mention of misadventure at the time of the procedure: Secondary | ICD-10-CM | POA: Diagnosis not present

## 2013-01-20 DIAGNOSIS — I9589 Other hypotension: Secondary | ICD-10-CM | POA: Diagnosis not present

## 2013-01-20 DIAGNOSIS — T451X5A Adverse effect of antineoplastic and immunosuppressive drugs, initial encounter: Secondary | ICD-10-CM

## 2013-01-20 DIAGNOSIS — C7949 Secondary malignant neoplasm of other parts of nervous system: Principal | ICD-10-CM | POA: Diagnosis present

## 2013-01-20 HISTORY — DX: Unspecified cord compression: G95.20

## 2013-01-20 HISTORY — DX: Nontraumatic extradural hemorrhage: I62.1

## 2013-01-20 HISTORY — DX: Acute embolism and thrombosis of deep veins of unspecified upper extremity: I82.629

## 2013-01-20 HISTORY — DX: Secondary malignant neoplasm of bone: C79.51

## 2013-01-20 LAB — CBC WITH DIFFERENTIAL/PLATELET
BASO%: 0.9 % (ref 0.0–2.0)
Eosinophils Absolute: 0 10*3/uL (ref 0.0–0.5)
LYMPH%: 4.8 % — ABNORMAL LOW (ref 14.0–49.0)
MCHC: 34.2 g/dL (ref 32.0–36.0)
MCV: 87.9 fL (ref 79.3–98.0)
MONO#: 0.7 10*3/uL (ref 0.1–0.9)
MONO%: 10.7 % (ref 0.0–14.0)
NEUT#: 5.1 10*3/uL (ref 1.5–6.5)
RBC: 2.51 10*6/uL — ABNORMAL LOW (ref 4.20–5.82)
RDW: 13 % (ref 11.0–14.6)
WBC: 6.1 10*3/uL (ref 4.0–10.3)

## 2013-01-20 LAB — IRON AND TIBC
TIBC: 276 ug/dL (ref 215–435)
UIBC: 245 ug/dL (ref 125–400)

## 2013-01-20 LAB — BASIC METABOLIC PANEL (CC13)
CO2: 29 mEq/L (ref 22–29)
Chloride: 83 mEq/L — ABNORMAL LOW (ref 98–107)
Sodium: 119 mEq/L — ABNORMAL LOW (ref 136–145)

## 2013-01-20 LAB — VITAMIN B12: Vitamin B-12: 910 pg/mL (ref 211–911)

## 2013-01-20 MED ORDER — LEVOTHYROXINE SODIUM 50 MCG PO TABS: 50.0000 ug | ORAL_TABLET | ORAL | Status: DC

## 2013-01-20 MED ORDER — TAMSULOSIN HCL 0.4 MG PO CAPS
0.4000 mg | ORAL_CAPSULE | Freq: Every evening | ORAL | Status: DC
  Administered 2013-01-20 – 2013-01-29 (×8): 0.4 mg via ORAL
  Filled 2013-01-20 (×11): qty 1

## 2013-01-20 MED ORDER — RISPERIDONE 0.25 MG PO TABS
0.2500 mg | ORAL_TABLET | Freq: Every evening | ORAL | Status: DC
  Administered 2013-01-20 – 2013-01-29 (×8): 0.25 mg via ORAL
  Filled 2013-01-20 (×11): qty 1

## 2013-01-20 MED ORDER — ZOLPIDEM TARTRATE 5 MG PO TABS: 5.0000 mg | ORAL_TABLET | Freq: Every evening | ORAL | Status: DC | PRN

## 2013-01-20 MED ORDER — MORPHINE SULFATE 2 MG/ML IJ SOLN: 2.0000 mg | INTRAMUSCULAR | Status: DC | PRN

## 2013-01-20 MED ORDER — LOSARTAN POTASSIUM 50 MG PO TABS
100.0000 mg | ORAL_TABLET | Freq: Every day | ORAL | Status: DC
  Administered 2013-01-20 – 2013-01-30 (×9): 100 mg via ORAL
  Filled 2013-01-20 (×11): qty 2

## 2013-01-20 MED ORDER — LEVOTHYROXINE SODIUM 50 MCG PO TABS
50.0000 ug | ORAL_TABLET | ORAL | Status: DC
  Administered 2013-01-21 – 2013-01-30 (×6): 50 ug via ORAL
  Filled 2013-01-20 (×9): qty 1

## 2013-01-20 MED ORDER — PANTOPRAZOLE SODIUM 40 MG PO TBEC
40.0000 mg | DELAYED_RELEASE_TABLET | Freq: Every day | ORAL | Status: DC
  Administered 2013-01-20 – 2013-01-30 (×9): 40 mg via ORAL
  Filled 2013-01-20 (×8): qty 1

## 2013-01-20 MED ORDER — LOSARTAN POTASSIUM 50 MG PO TABS: 100.0000 mg | ORAL_TABLET | Freq: Every day | ORAL | Status: DC

## 2013-01-20 MED ORDER — ONDANSETRON HCL 4 MG PO TABS: 8.0000 mg | ORAL_TABLET | Freq: Three times a day (TID) | ORAL | Status: DC | PRN

## 2013-01-20 MED ORDER — LEVOTHYROXINE SODIUM 100 MCG PO TABS
100.0000 ug | ORAL_TABLET | ORAL | Status: DC
  Administered 2013-01-25 – 2013-01-29 (×2): 100 ug via ORAL
  Filled 2013-01-20 (×3): qty 1

## 2013-01-20 MED ORDER — PROCHLORPERAZINE MALEATE 10 MG PO TABS
10.0000 mg | ORAL_TABLET | Freq: Four times a day (QID) | ORAL | Status: DC | PRN
  Filled 2013-01-20: qty 1

## 2013-01-20 MED ORDER — GADOBENATE DIMEGLUMINE 529 MG/ML IV SOLN
17.0000 mL | Freq: Once | INTRAVENOUS | Status: AC | PRN
  Administered 2013-01-20: 17 mL via INTRAVENOUS

## 2013-01-20 MED ORDER — ESCITALOPRAM OXALATE 20 MG PO TABS
20.0000 mg | ORAL_TABLET | Freq: Every day | ORAL | Status: DC
  Administered 2013-01-20 – 2013-01-30 (×9): 20 mg via ORAL
  Filled 2013-01-20 (×11): qty 1

## 2013-01-20 MED ORDER — DEXAMETHASONE 4 MG PO TABS
4.0000 mg | ORAL_TABLET | Freq: Four times a day (QID) | ORAL | Status: DC
  Administered 2013-01-20 – 2013-01-30 (×36): 4 mg via ORAL
  Filled 2013-01-20 (×48): qty 1

## 2013-01-20 NOTE — H&P (Signed)
[facility]  [phone] [phone]   OFFICE PROGRESS NOTE   Cc:  Eustaquio Boyden, MD  CHIEF COMPLAINT: Lower extremity weakness, low sodium, and anemia.  INTERVAL HISTORY: [name] [name] 56 y.o. male is seen for a work-in appointment today with his wife. He was seen earlier today by Dr Basilio Cairo in Radiation Oncology to discuss palliative XRT. There was concern for cord compression given his report of numbness/weaknes in his lower extremities. He was also referred to [place] due to abnormal labs. The patient reports that his lower extremity weakness and numbness started 2 days ago. He was having difficulty walking and his wife gave him a cane to use. Weakness has progressively worsened and he is now requiring assistance from wife with walking to the bathroom and dressing himself. He fell at home yesterday without any injury. He reports pain to his mid-back. He is having more fatigue. Denies headaches, visual changes. No dysphagia. Denies chest pain, shortness of breath. He has dyspnea with minimal exertion. No abdominal pain, nausea, vomiting. No tremors or seizures. Denies epistaxis, hemoptysis, hematuria, hematochezia, and melena. States that he has a good appetite. Weight is stable. States he stopped drinking alcohol 2 days ago and wife confirms this. Drinks 8-10 glasses of water per day. The rest of the 14-point review of system was negative.   Of note, the patient was seen in our office in May 2014 and it was recommended that he be admitted to the hospital at that time for hyponatremia. He declined, but was admitted to [facility] regional. Records are not available to me. The patient tells me that he received IV fluids and one unit of blood during that admission. He was hospitalized for only 2 days.   Past Medical History  Diagnosis Date  . Squamous cell carcinoma of head and neck 2011    Unknown primary. He presented with right sided ear pain. Biopsy in the  neck nodes showed squamous cell carcinoma. He underwent neck dissection then chemoradiation. He developed lung nodules in August 2012. He received carboplatin, 5-FU, cetuximab which in October 2012 until may 2013 with multiple dose delays and reductions because of toxicities. He had disease progression in the lungs in may 2013.  Marland Kitchen Hearing loss     wears aides  . Hypothyroid   . Hypertension   . Gastroesophageal reflux disease     well controlled on omeprazole  . Obstructive sleep apnea     not using CPAP  . Anemia   . Dysphagia   . BPH (benign prostatic hyperplasia)   . Restlessness and agitation     unclear etiology.  His previous physicians gave him  Risperdal.   . Urge incontinence   . Alcohol abuse   . History of smoking   . Emphysema 2013    mild centrilobular per CT  . Lung metastases 01/06/2013    Past Surgical History  Procedure Laterality Date  . Modified radical neck dissection  2011    left side.   . Thyroidectomy, partial  2011  . Lung biopsy    . Other surgical history  2008, 2009    L femur rod  . Other surgical history  2008    R radius    Current Outpatient Prescriptions  Medication Sig Dispense Refill  . Albuterol Sulfate (PROAIR HFA IN) Inhale 8.5 g into the lungs as needed.      Marland Kitchen aspirin 81 MG tablet Take 81 mg by mouth daily.      Marland Kitchen  Cholecalciferol (VITAMIN D3) 5000 UNITS TABS Take 1 tablet by mouth daily.      Marland Kitchen escitalopram (LEXAPRO) 20 MG tablet TAKE 1 TABLET BY MOUTH EVERY DAY  90 tablet  3  . levothyroxine (SYNTHROID, LEVOTHROID) 50 MCG tablet Take 1 tablet (50 mcg total) by mouth daily. (take 2 tablets twice weekly)  40 tablet  11  . losartan (COZAAR) 100 MG tablet TAKE 1 TABLET BY MOUTH EVERY DAY  30 tablet  3  . magnesium gluconate (MAGONATE) 500 MG tablet Take 500 mg by mouth daily.      . NON FORMULARY daily. Flor-essence 2 TBSP      . omeprazole (PRILOSEC) 40 MG capsule Take 1 capsule (40 mg total) by mouth daily.  90 capsule  1  .  ondansetron (ZOFRAN) 8 MG tablet Take 8 mg by mouth every 8 (eight) hours as needed.      . prochlorperazine (COMPAZINE) 10 MG tablet Take 10 mg by mouth every 6 (six) hours as needed.      . risperiDONE (RISPERDAL) 0.25 MG tablet TAKE 1 TABLET BY MOUTH EVERY DAY  90 tablet  3  . tamsulosin (FLOMAX) 0.4 MG CAPS Take 1 capsule (0.4 mg total) by mouth daily.  90 capsule  1   No current facility-administered medications for this visit.    ALLERGIES:  is allergic to benadryl.  REVIEW OF SYSTEMS:  The rest of the 14-point review of system was negative.   Filed Vitals:   01/20/13 1030  BP: 143/91  Pulse: 78  Temp: 97.7 F (36.5 C)  Resp: 18   Wt Readings from Last 3 Encounters:  01/20/13 185 lb (83.915 kg)  01/20/13 184 lb 6.4 oz (83.643 kg)  01/06/13 187 lb 8 oz (85.049 kg)   ECOG Performance status: 2  PHYSICAL EXAMINATION:   General: Thin-appearing man, in no acute distress. Eyes: no scleral icterus. ENT: There were no oropharyngeal lesions. Neck was without thyromegaly. His left neck was fibrotic but without palpable mass. Lymphatics: Negative cervical, supraclavicular or axillary adenopathy. Respiratory: lungs were clear bilaterally without wheezing or crackles. Cardiovascular: Regular rate and rhythm, S1/S2, without murmur, rub or gallop. There was no pedal edema. GI: abdomen was soft, flat, nontender, nondistended, without organomegaly. Muscoloskeletal: no spinal tenderness of palpation of vertebral spine. Skin exam was without echymosis, petichae. Neuro exam was nonfocal. Patient was able to get on and off exam table with assistance of two people. Patient had noticeable difficulty moving his left leg. Patient was alert and oriented. Attention was good. Language was appropriate. Mood was normal without depression. Speech was not pressured. Thought content was not tangential.   Stool for occult blood was negative.  LABORATORY/RADIOLOGY DATA:  Lab Results  Component Value Date    WBC 6.1 01/20/2013   HGB 7.5* 01/20/2013   HCT 22.1* 01/20/2013   PLT 246 01/20/2013   GLUCOSE 93 01/20/2013   ALKPHOS 90 01/06/2013   ALT 10 01/06/2013   AST 20 01/06/2013   NA 119* 01/20/2013   K 4.9 01/20/2013   CL 83* 01/20/2013   CREATININE 0.9 01/20/2013   BUN 8.5 01/20/2013   CO2 29 01/20/2013    ASSESSMENT AND PLAN:    1. 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 There is concern for cord compression. We will obtain an MRI of the thoracic and lumbar spine. If there is evidence of cord compression, we will consult radiation oncology. Will  begin dexamethasone every 6 hours.  2. Hyponatremia: This may be due to his history of extensive alcohol use versus SIADH. He is asymptomatic at this time. We will watch his labs closely. Will restrict his fluids.  3. Anemia: Suspicious for GI bleed, but stool for occult blood was negative. Will obtain additional studies including LDH, reticulocyte count, SPEP, light chains, iron studies, and vitamin B12 level. Transfuse 1 unit of packed red blood cells.  4. 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 CT chest in May 2014 showed disease progression.   - The most concerning area was the left thoracic inlet.  This can erode and cause problem with his airway down the line.  Given his co-morbidities, and other lesions elsewhere, I do not think that resection is a good idea. He has been referred him to Rad Onc for evaluation for palliative XRT.  If after palliative XRT and he still has disease progression in the area, then we can consider salvage resection. - He does have slight progression of disease in the lung parenchyma; however, the nodules are still subcm.  Chemo at this time for these lesions will not be palliative and more chance of side effects given his co-morbidities.  5. Hypothyroidism:  On synthroid.  We will check his TSH level has his wife reports that he's been doubling up on his Synthroid on his own. His normal dose is at 50 mcg 5 days per week and 100 mcg 2 days per week. For the past month he has been taking 100 mcg daily.  6. History of smoking and chewing tobacco:he claimed that he is not doing this anymore.   7. Current EtOH: He typically drinks a 12 pack per day. States he has not had any alcohol in the past 2 days.  8. BPH: He is onTamsulosin and Vesicare.   9. Depression/anxiety: On Lexapro and Risperdal.   10. Hypertension: The patient is on losartan per PCP.     ====================  ATTENDING'S ATTESTATION:  I personally reviewed patient's chart, examined patient myself, formulated the treatment plan as detailed above.

## 2013-01-20 NOTE — Progress Notes (Signed)
Radiation Oncology         [phone] ________________________________  Initial outpatient Consultation  Name: [name] [name] MRN: [id number]  Date: 01/20/2013  DOB: Jan 22, 1956  WU:[id number] Sharen Hones, MD  Exie Parody, MD   REFERRING PHYSICIAN: Exie Parody, MD  DIAGNOSIS: Lung/thoracic inlet metastases from squamous cell carcinoma of the head and neck  HISTORY OF PRESENT ILLNESS::[name] [name] is a 57 y.o. male  with multiple outside records as well as records in our medical system. His wife provides a thorough history. The patient himself is somewhat confused today. His wife reports that he was treated with surgery in February 2011 for a neck mass. This was performed in [place]. He was ultimately diagnosed with head and neck squamous cell carcinoma of unknown primary. He received chemoradiation between March and May of 2011. His radiation was provided at [facility] in [place]. He developed lung metastases in 2012. He received carboplatin 5-FU and cetuximab from October 2012 until May 2013. He had multiple dose delays and reductions because of toxicities. He received Taxol chemotherapy in August 2013. The patient 's wife report that this is his most recent cycle of chemotherapy and he has been receiving none since then.  The patient relocated to [place] due to proximity to his children. He lives in [place]. He underwent a CT scan of his chest contrast on 01/05/2013. This demonstrates interval progression of disease compared to November 2013. There is a soft tissue attenuated mass in the thoracic inlet which invades the chest wall and internal mammary lymph node chain. He also has multiple bilateral pulmonary nodules. Dr. [name] has concern that the thoracic inlet mass could start causing airway compromise or other problems if it continues to grow. No immediate chemotherapy has been planned. The patient has multiple comorbidities this is not felt to be an ideal surgical candidate.  He  has a history of excessive beer consumption. In the past several weeks, he has had episodes of quitting cold [place]. He does not eat a lot, but has ~ a couple slices a pizza day. He has chronic hyponatremia. He was recently hospitalized at in [place] for this. His hyponatremia cell to be due to his alcohol consumption versus SIADH. His wife reports that he has confusion sometimes.   On further questioning he apparently has had significant back pain in the mid to lower thoracic region since March 2014. He did take Flexeril for 30 days - the back pain did not improve significantly. For the past 48 hours he has had new onset numbness from the lower thoracic spine through his toes. He may have some weakness in his lower extremities but it's hard for him to tell because he is numb. He denies any bowel or bladder incontinence or difficulty voiding. He is [name] to ambulate with a cane, but with difficulty  PREVIOUS RADIATION THERAPY: Yes - as described above to the head and neck region, approximately 35 treatments  PAST MEDICAL HISTORY:  has a past medical history of Squamous cell carcinoma of head and neck (2011); Hearing loss; Hypothyroid; Hypertension; Gastroesophageal reflux disease; Obstructive sleep apnea; Anemia; Dysphagia; BPH (benign prostatic hyperplasia); Restlessness and agitation; Urge incontinence; Alcohol abuse; History of smoking; Emphysema (2013); and Lung metastases (01/06/2013).    PAST SURGICAL HISTORY: Past Surgical History  Procedure Laterality Date  . Modified radical neck dissection  2011    left side.   . Thyroidectomy, partial  2011  . Lung biopsy    . Other surgical history  2008, 2009    L femur rod  . Other surgical history  2008    R radius    FAMILY HISTORY: family history includes Cancer in his father and maternal grandfather and Hypertension in his father.  There is no history of CAD, and Stroke, and Diabetes, .  SOCIAL HISTORY:  reports that he quit smoking about 11  years ago. He quit smokeless tobacco use about 11 years ago. He reports that  drinks alcohol. He reports that he does not use illicit drugs.  ALLERGIES: Benadryl  MEDICATIONS:  Current Outpatient Prescriptions  Medication Sig Dispense Refill  . Albuterol Sulfate (PROAIR HFA IN) Inhale 8.5 g into the lungs as needed.      Marland Kitchen aspirin 81 MG tablet Take 81 mg by mouth daily.      . Cholecalciferol (VITAMIN D3) 5000 UNITS TABS Take 1 tablet by mouth daily.      Marland Kitchen escitalopram (LEXAPRO) 20 MG tablet TAKE 1 TABLET BY MOUTH EVERY DAY  90 tablet  3  . levothyroxine (SYNTHROID, LEVOTHROID) 50 MCG tablet Take 1 tablet (50 mcg total) by mouth daily. (take 2 tablets twice weekly)  40 tablet  11  . losartan (COZAAR) 100 MG tablet TAKE 1 TABLET BY MOUTH EVERY DAY  30 tablet  3  . magnesium gluconate (MAGONATE) 500 MG tablet Take 500 mg by mouth daily.      . NON FORMULARY daily. Flor-essence 2 TBSP      . omeprazole (PRILOSEC) 40 MG capsule Take 1 capsule (40 mg total) by mouth daily.  90 capsule  1  . ondansetron (ZOFRAN) 8 MG tablet Take 8 mg by mouth every 8 (eight) hours as needed.      . prochlorperazine (COMPAZINE) 10 MG tablet Take 10 mg by mouth every 6 (six) hours as needed.      . risperiDONE (RISPERDAL) 0.25 MG tablet TAKE 1 TABLET BY MOUTH EVERY DAY  90 tablet  3  . tamsulosin (FLOMAX) 0.4 MG CAPS Take 1 capsule (0.4 mg total) by mouth daily.  90 capsule  1   No current facility-administered medications for this encounter.    REVIEW OF SYSTEMS: As above   PHYSICAL EXAM:  weight is 184 lb 6.4 oz (83.643 kg). His temperature is 98.4 F (36.9 C). His blood pressure is 156/80 and his pulse is 85. His respiration is 20 and oxygen saturation is 100%.   General: In wheelchair, showing signs of confusion in no acute distress HEENT: Scars over his scalp; Extraocular movements are intact. Oropharynx is clear. 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 Significant tissue deficit in the left neck,  postsurgical. Erythema/telangiectasias of the skin most profound in the left neck but also noticeable on the right ; no palpable lymphadenopathy in cervical or supraclavicular regions Heart: Regular in rate and rhythm with no murmurs, rubs, or gallops. Chest: Expiratory low pitched sounds - lung bases bilaterally Abdomen: Soft, nontender, nondistended, with no rigidity or guarding. Extremities: No cyanosis or edema. Lymphatics: No concerning lymphadenopathy. Skin: See neck exam. Musculoskeletal: symmetric strength and muscle tone throughout - no profound weakness, though perhaps a little bit of weakness with hip flexion bilaterally. Tender in mid/lower T spine Neurologic: Cranial nerves II through XII are grossly intact. Speech is fluent. He does show signs of confusion. He reports profound decrease in sensation from the level of the mid to lower thoracic spine through his toes bilaterally     LABORATORY DATA:  Lab Results  Component Value Date  WBC 6.1 01/20/2013   HGB 7.5* 01/20/2013   HCT 22.1* 01/20/2013   MCV 87.9 01/20/2013   PLT 246 01/20/2013   CMP     Component Value Date/Time   NA 119* 01/20/2013 0806   NA 120* 12/03/2012 0836   K 4.9 01/20/2013 0806   K 5.1 12/03/2012 0836   CL 83* 01/20/2013 0806   CL 82* 12/03/2012 0836   CO2 29 01/20/2013 0806   CO2 29 12/03/2012 0836   GLUCOSE 93 01/20/2013 0806   GLUCOSE 81 12/03/2012 0836   BUN 8.5 01/20/2013 0806   BUN 11 12/03/2012 0836   CREATININE 0.9 01/20/2013 0806   CREATININE 0.9 12/03/2012 0836   CALCIUM 9.2 01/20/2013 0806   CALCIUM 8.9 12/03/2012 0836   PROT 7.3 01/06/2013 0859   ALBUMIN 3.8 01/06/2013 0859   AST 20 01/06/2013 0859   ALT 10 01/06/2013 0859   ALKPHOS 90 01/06/2013 0859   BILITOT 0.33 01/06/2013 0859         RADIOGRAPHY: Ct Chest W Contrast  01/05/2013   *RADIOLOGY REPORT*  Clinical Data: Metastatic head neck cancer  CT CHEST WITH CONTRAST  Technique:  Multidetector CT imaging of the chest was performed following the standard  protocol during bolus administration of intravenous contrast.  Contrast: 80mL OMNIPAQUE IOHEXOL 300 MG/ML  SOLN  Comparison: 07/07/12  Findings: There is no pleural effusion identified.  Multi focal pulmonary nodules are identified.  Right upper lobe partially cavitary nodule measures 1.2 cm, image 14/series 5.  This is compared with an 1.3 cm previously.  Nodule within superior segment of the right lower lobe measures 4 mm, image 27/series 5.  This is compared with 5 mm previously. Right lower lobe nodule measures 5 mm, image 37/series 5. Previously this measured the same.  New right lower lobe nodule is identified measuring 5 mm, image 42/series 5.  Within the left lower lobe there is a stable 8 mm nodule, image 42/series 5.  New left upper lobe nodule measures 7 mm, image 22/series 5.  Also new is a subpleural nodule on the left upper lobe measuring 8 mm, image 28/series 5.  Heart size is normal.  No pericardial effusion.  At the thoracic inlet there is a tumor which is extending into the chest wall and along the left internal mammary lymph node chain.  This measures 3.3 x 3.9 x 3.4 cm.  Limited imaging through the upper abdomen shows no acute findings.  There is multilevel spondylosis noted within the thoracic spine.  IMPRESSION:  1.  Interval progression of disease.  There is a soft tissue attenuating mass at the thoracic inlet which is invading the chest wall and internal mammary lymph node chain.  This is new from previous exam. 2.  Multiple bilateral pulmonary nodules.  Previously demonstrated nodules are not significantly changed from previous exam.  There are new nodules however within the right lower lobe and left upper lobe which are concerning for progression of disease.   Original Report Authenticated By: Signa Kell, M.D.      IMPRESSION/PLAN: Is a lovely 57 year old gentleman with metastatic head and neck cancer. His multiple comorbidities including chronic hyponatremia, alcohol abuse.  1)  recommend urgent MRI of the thoracic spine to rule out cord compression. Concerned about his back pain and new onset numbness. This has been scheduled for this afternoon. Medical oncology will start Decadron.  2) I think it is reasonable for the patient to receive palliative radiotherapy to the thoracic inlet mass for local  control. He did have prior radiotherapy which warrants review of his outside records to determine technique and dose for treatment to minimize overlap of fields. Anticipate the patient will need at least 2 weeks of radiotherapy, total of 10 fractions at least. Since the patient lives in [place], he has reservations about coming for multiple treatments to [place]. He wishes that referral to the radiation oncology clinic in [place]. I will make that referral.  3) due to the patient's new lab work which includes anemia and continued hyponatremia, Dr. [name] of  medical oncology would like to meet with the patient immediately. Possibility of inpatient admission. We will facilitate transfer of patient to medical oncology now.  I spent 45 minutes minutes face to face with the patient and more than 50% of that time was spent in counseling and/or coordination of care.    __________________________________________   Lonie Peak, MD

## 2013-01-20 NOTE — Care Management Note (Signed)
CARE MANAGEMENT NOTE 01/20/2013  Patient:  [name] [name],[name] [name]   Account Number:  [id number]  Date Initiated:  01/20/2013  Documentation initiated by:  Osinachi Navarrette  Subjective/Objective Assessment:   57 yo male admitted with anemia, lower ex weakness, & hyponatremia. Hx of lung mets.     Action/Plan:   Home when stable   Anticipated DC Date:     Anticipated DC Plan:  HOME/SELF CARE      DC Planning Services  CM consult      Choice offered to / List presented to:  NA   DME arranged  NA      DME agency  NA     HH arranged  NA      HH agency  NA   Status of service:  In process, will continue to follow Medicare Important Message given?   (If response is "NO", the following Medicare IM given date fields will be blank) Date Medicare IM given:   Date Additional Medicare IM given:    Discharge Disposition:    Per UR Regulation:  Reviewed for med. necessity/level of care/duration of stay  If discussed at Long Length of Stay Meetings, dates discussed:    Comments:  01/20/13 1255 Emmerie Battaglia,RN,BSN [phone] Chart reviewed for utilization of hospital services. PTA pt from home with spouse. Will continue to follow for possible dc needs. No needs indetified at this time.

## 2013-01-20 NOTE — Progress Notes (Signed)
CRITICAL VALUE ALERT  Critical value received:  Serum osm 248  Date of notification:  01/20/13  Time of notification:  1915  Critical value read back:yes  Nurse who received alert:  Selinda Flavin, RN  MD notified (1st page):  Dr Beulah Gandy  Time of first page:  1917  MD notified (2nd page):  Time of second page:  Responding MD:  Dr. Beulah Gandy  Time MD responded:  1920, no new orders

## 2013-01-20 NOTE — Progress Notes (Signed)
See admission H&P from same date of service.

## 2013-01-20 NOTE — Progress Notes (Signed)
Patient's hourly blood vitals taken, BP elevated 180/100 manually HR 99. Patient asymptomatic. MD on call notified, Patient's BP down to 162/100 HR 90. Will retake vitals in 30 mins and page MD if above 170 systolic.

## 2013-01-20 NOTE — Progress Notes (Signed)
Please see the Nurse Progress Note in the MD Initial Consult Encounter for this patient. 

## 2013-01-20 NOTE — Consult Note (Signed)
Reason for Consult:paraparesis, thoracic metastasis Referring Physician: Dr. Antony Blackbird  [name] [name] is an 57 y.o. male.  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 He has known lung metastases. The patient has developed progressive back pain with lower extremity numbness over the last week. 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 The patient was evaluated by Dr. Antony Blackbird for palliative radiation therapy. Dr. Roselind Messier at request a neurosurgical consultation.  Presently the patient is alert and pleasant. He complains of back pain and lower extremity numbness as above. He states he has not been able to walk over the last several days. He uses a wheelchair.  Past Medical History  Diagnosis Date  . Squamous cell carcinoma of head and neck 2011    Unknown primary. He presented with right sided ear pain. Biopsy in the neck nodes showed squamous cell carcinoma. He underwent neck dissection then chemoradiation. He developed lung nodules in August 2012. He received carboplatin, 5-FU, cetuximab which in October 2012 until may 2013 with multiple dose delays and reductions because of toxicities. He had disease progression in the lungs in may 2013.  Marland Kitchen Hearing loss     wears aides  . Hypothyroid   . Hypertension   . Gastroesophageal reflux disease     well controlled on omeprazole  . Obstructive sleep apnea     not using CPAP  . Anemia   . Dysphagia   . BPH (benign prostatic hyperplasia)   . Restlessness and agitation     unclear etiology.  His previous physicians gave him  Risperdal.   . Urge incontinence   . Alcohol abuse   . History of smoking   . Emphysema 2013    mild centrilobular per CT  . Lung metastases 01/06/2013    Past Surgical History  Procedure Laterality Date  . Modified radical neck  dissection  2011    left side.   . Thyroidectomy, partial  2011  . Lung biopsy    . Other surgical history  2008, 2009    L femur rod  . Other surgical history  2008    R radius    Family History  Problem Relation Age of Onset  . Cancer Father     Melanoma  . Cancer Maternal Grandfather     laryngeal  . Hypertension Father   . CAD Neg Hx   . Stroke Neg Hx   . Diabetes Neg Hx     Social History:  reports that he quit smoking about 11 years ago. He quit smokeless tobacco use about 11 years ago. He reports that  drinks alcohol. He reports that he does not use illicit drugs.  Allergies:  Allergies  Allergen Reactions  . Benadryl (Diphenhydramine Hcl) Anxiety    IV only-oral okay    Medications:  I have reviewed the patient's current medications. Prior to Admission:  Prescriptions prior to admission  Medication Sig Dispense Refill  . aspirin 81 MG tablet Take 81 mg by mouth every morning.       . Cholecalciferol (VITAMIN D3) 5000 UNITS TABS Take 2 tablets by mouth every evening.       . escitalopram (LEXAPRO) 20 MG tablet TAKE 1 TABLET BY MOUTH EVERY DAY  90 tablet  3  . levothyroxine (SYNTHROID, LEVOTHROID) 50 MCG tablet Take 50-100 mcg by  mouth See admin instructions. 50 mcg daily with the exception of 2 days out the week patient takes 100 mcg (no specific day)  40 tablet  11  . losartan (COZAAR) 100 MG tablet       . magnesium gluconate (MAGONATE) 500 MG tablet Take 500 mg by mouth every evening.       . naproxen sodium (ANAPROX) 220 MG tablet Take 440 mg by mouth every morning.      . NON FORMULARY Take 30 mLs by mouth every morning. Flor-essence 2 TBSP      . omeprazole (PRILOSEC) 40 MG capsule Take 40 mg by mouth every morning.      . ondansetron (ZOFRAN) 8 MG tablet Take 8 mg by mouth every 8 (eight) hours as needed for nausea.       . prochlorperazine (COMPAZINE) 10 MG tablet Take 10 mg by mouth every 6 (six) hours as needed (nausea).       . risperiDONE (RISPERDAL)  0.25 MG tablet Take 0.25 mg by mouth every evening.      . tamsulosin (FLOMAX) 0.4 MG CAPS Take 0.4 mg by mouth every evening.      . [DISCONTINUED] losartan (COZAAR) 100 MG tablet TAKE 1 TABLET BY MOUTH EVERY DAY  30 tablet  3  . [DISCONTINUED] omeprazole (PRILOSEC) 40 MG capsule Take 1 capsule (40 mg total) by mouth daily.  90 capsule  1  . [DISCONTINUED] tamsulosin (FLOMAX) 0.4 MG CAPS Take 1 capsule (0.4 mg total) by mouth daily.  90 capsule  1  . Albuterol Sulfate (PROAIR HFA IN) Inhale 1 puff into the lungs every 6 (six) hours as needed (FOR SHORTNESS OF BREATH).        Scheduled: . dexamethasone  4 mg Oral Q6H  . escitalopram  20 mg Oral Daily  . [START ON 01/22/2013] levothyroxine  100 mcg Oral Custom  . [START ON 01/21/2013] levothyroxine  50 mcg Oral Custom  . losartan  100 mg Oral Daily  . pantoprazole  40 mg Oral Daily  . risperiDONE  0.25 mg Oral QPM  . tamsulosin  0.4 mg Oral QPM   Continuous:  WUJ:[id number] injection, ondansetron, prochlorperazine, zolpidem Anti-infectives   None       Results for orders placed during the hospital encounter of 01/20/13 (from the past 48 hour(s))  PREPARE RBC (CROSSMATCH)     Status: None   Collection Time    01/20/13 12:30 PM      Result Value Range   Order Confirmation ORDER PROCESSED BY BLOOD BANK    OSMOLALITY     Status: Abnormal   Collection Time    01/20/13  1:15 PM      Result Value Range   Osmolality 248 (*) 275 - 300 mOsm/kg   Comment: Result repeated and verified.     CRITICAL RESULT CALLED TO, READ BACK BY AND VERIFIED WITH:     SAUVEL/[id number]  FERRITIN     Status: None   Collection Time    01/20/13  1:15 PM      Result Value Range   Ferritin 110  22 - 322 ng/mL  IRON AND TIBC     Status: Abnormal   Collection Time    01/20/13  1:15 PM      Result Value Range   Iron 31 (*) 42 - 135 ug/dL   TIBC 295  621 - 308 ug/dL   Saturation Ratios 11 (*) 20 - 55 %   UIBC 245  125 -  400 ug/dL  VITAMIN W09      Status: None   Collection Time    01/20/13  1:15 PM      Result Value Range   Vitamin B-12 910  211 - 911 pg/mL  TYPE AND SCREEN     Status: None   Collection Time    01/20/13  1:15 PM      Result Value Range   ABO/RH(D) A POS     Antibody Screen NEG     Sample Expiration 01/23/2013     Unit Number [id number]     Blood Component Type RBC LR PHER2     Unit division 00     Status of Unit ISSUED     Transfusion Status OK TO TRANSFUSE     Crossmatch Result Compatible    ABO/RH     Status: None   Collection Time    01/20/13  1:15 PM      Result Value Range   ABO/RH(D) A POS      Mr Thoracic Spine W Wo Contrast  01/20/2013   *RADIOLOGY REPORT*  Clinical Data: Squamous cell carcinoma with metastatic disease. Back pain and bilateral leg numbness.  Evaluate for metastatic spread.  MRI THORACIC SPINE WITHOUT AND WITH CONTRAST  Technique:  Multiplanar and multiecho pulse sequences of the thoracic spine were obtained without and with intravenous contrast.  Contrast: 17mL MULTIHANCE GADOBENATE DIMEGLUMINE 529 MG/ML IV SOLN  Comparison: CT 01/05/2013.  Findings: Counting was performed from the craniocervical junction. There is metastatic infiltration of T1-T3 which is extensive.  No pathologic compression fracture. Loss of the subarachnoid space at these levels with abnormal enhancing soft tissue around the thecal sac and cord anteriorly and posteriorly compatible with extraosseous extension of tumor. Complete effacement of the subarachnoid space with transverse and AP compression of the thoracic cord.  Extension into both neural foramina is present at T2-T3 and into the right neural foramen at T3-T4 and T1-T2.  Tumor extends into the paravertebral fat.  Pulmonary metastatic nodule in the posterior right upper lobe is again noted.  No convincing evidence of drop metastases in the lower thoracic spine. There is diffuse infiltration of the posterior elements from T1-T3. The bony metastatic disease also  involves T4 and T5 although to a lesser extent and T1-T3. Heterogeneous marrow signal is present throughout, likely associated with chronic disease.  Mid thoracic compression fractures are present which appear chronic, without marrow edema.  Minimal thoracic spine degenerative disease is present with a few shallow disc bulges, most pronounced at T9-T10. Left interpolar renal cyst incidentally noted.  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  This encircles the thecal sac and produces severe stenosis with compression of the thoracic cord and complete effacement of the subarachnoid space.  Right greater than left foraminal invasion by tumor. Bony metastatic disease is evident from T1-T5.  There may be another small focus in the dorsal aspect of T7.  Critical Value/emergent results were called by telephone at the time of interpretation on 01/20/2013 at 1828 hours to Dr. [name], who verbally acknowledged these results.   Original Report Authenticated By: [name], M.D.    Review of systems: As above Blood pressure 115/80, pulse 83, temperature 99.5 F (37.5 C), temperature source Oral, resp. rate 16, height 5\' 11"  (1.803 m), weight 83.915 kg (185 lb), SpO2 97.00%. Physical exam:  Gen.: An alert and pleasant 57 year old white male in no apparent distress.  Neck: The  patient has had radiation therapy and has radiation dermatitis.  Thorax: Symmetric  Extremities: No obvious deformities  Neurologic exam: The patient is alert and oriented x3. Glasgow Coma Scale 15. Cranial nerves II through XII were examined bilaterally and grossly normal. Vision and hearing are grossly normal bilaterally. The patient's motor strength is 5 over 5 his bilateral deltoid, biceps, triceps, hand grip, psoas, quadriceps, gastrocnemius, dorsiflexors. Sensory exam demonstrates decreased light-touch sensation in his thorax and bilateral lower extremities.  Cerebellar function is grossly normal. Deep tendon reflexes are one over four his bile biceps triceps and two over four in bilateral quadriceps and gastrocnemius there is no ankle clonus.  Imaging studies: I reviewed the patient's thoracic MRI performed at [facility] today with a without contrast. It demonstrates the patient has significant epidural metastasis from proximal and T1-T3 with spinal cord compression. He may have bony disease elsewhere.  Assessment/Plan: T1-T3 thoracic metastasis, paraparesis: I discussed the situation with Dr. Roselind Messier and the patient. We have discussed the various treatment options including doing nothing, palliative radiation, and surgery.  I have described the surgical treatment option of a thoracic laminectomy for debulking of the epidural tumor and decompression of the spinal cord. I have described the surgery to him. We have discussed the risk of surgery including risks of anesthesia, hemorrhage, infection, spinal fluid leak, spinal cord injury causing paralysis, medical risk, etc. I've explained to him that I think that without surgery he will become paraplegic in the near future. I have answered all his questions. He is going to think things over.  If the patient decides to proceed with surgery we'll plan to transfer him to [facility] tomorrow for surgery tomorrow afternoon. In the meantime we will continue Decadron.  [name] [name] D 01/20/2013, 9:11 PM

## 2013-01-21 ENCOUNTER — Encounter (HOSPITAL_COMMUNITY): Payer: Self-pay | Admitting: Anesthesiology

## 2013-01-21 ENCOUNTER — Inpatient Hospital Stay (HOSPITAL_COMMUNITY): Payer: Federal, State, Local not specified - PPO | Admitting: Anesthesiology

## 2013-01-21 ENCOUNTER — Inpatient Hospital Stay (HOSPITAL_COMMUNITY): Payer: Federal, State, Local not specified - PPO

## 2013-01-21 ENCOUNTER — Encounter (HOSPITAL_COMMUNITY)
Admission: AD | Disposition: A | Discharge status: Hospice | Payer: Self-pay | Source: Ambulatory Visit | Attending: Oncology

## 2013-01-21 ENCOUNTER — Encounter: Payer: Self-pay | Admitting: Family Medicine

## 2013-01-21 DIAGNOSIS — G992 Myelopathy in diseases classified elsewhere: Secondary | ICD-10-CM

## 2013-01-21 HISTORY — PX: LAMINECTOMY: SHX219

## 2013-01-21 LAB — PREPARE RBC (CROSSMATCH)

## 2013-01-21 LAB — SURGICAL PCR SCREEN
MRSA, PCR: POSITIVE — AB
Staphylococcus aureus: POSITIVE — AB

## 2013-01-21 LAB — CBC
Hemoglobin: 8.9 g/dL — ABNORMAL LOW (ref 13.0–17.0)
MCH: 28.9 pg (ref 26.0–34.0)
MCV: 85.7 fL (ref 78.0–100.0)
RBC: 3.08 MIL/uL — ABNORMAL LOW (ref 4.22–5.81)

## 2013-01-21 LAB — BASIC METABOLIC PANEL
CO2: 29 mEq/L (ref 19–32)
Glucose, Bld: 131 mg/dL — ABNORMAL HIGH (ref 70–99)
Potassium: 4.6 mEq/L (ref 3.5–5.1)
Sodium: 122 mEq/L — ABNORMAL LOW (ref 135–145)

## 2013-01-21 LAB — OSMOLALITY, URINE: Osmolality, Ur: 137 mOsm/kg — ABNORMAL LOW (ref 390–1090)

## 2013-01-21 SURGERY — THORACIC LAMINECTOMY FOR TUMOR
Anesthesia: General | Site: Back | Wound class: Clean

## 2013-01-21 MED ORDER — LIDOCAINE HCL 4 % MT SOLN
OROMUCOSAL | Status: DC | PRN
  Administered 2013-01-21: 4 mL via TOPICAL

## 2013-01-21 MED ORDER — ALBUMIN HUMAN 5 % IV SOLN
INTRAVENOUS | Status: DC | PRN
  Administered 2013-01-21: 19:00:00 via INTRAVENOUS

## 2013-01-21 MED ORDER — THROMBIN 20000 UNITS EX KIT
PACK | CUTANEOUS | Status: DC | PRN
  Administered 2013-01-21: 19:00:00 via TOPICAL

## 2013-01-21 MED ORDER — OXYCODONE HCL 5 MG PO TABS: 5.0000 mg | ORAL_TABLET | Freq: Once | ORAL | Status: AC | PRN

## 2013-01-21 MED ORDER — HYDROCODONE-ACETAMINOPHEN 5-325 MG PO TABS: 1.0000 | ORAL_TABLET | ORAL | Status: DC | PRN

## 2013-01-21 MED ORDER — NALOXONE HCL 0.4 MG/ML IJ SOLN: 0.4000 mg | INTRAMUSCULAR | Status: DC | PRN

## 2013-01-21 MED ORDER — SODIUM CHLORIDE 0.9 % IV SOLN
INTRAVENOUS | Status: DC | PRN
  Administered 2013-01-21: 19:00:00 via INTRAVENOUS

## 2013-01-21 MED ORDER — SODIUM CHLORIDE 0.9 % IJ SOLN: 9.0000 mL | INTRAMUSCULAR | Status: DC | PRN

## 2013-01-21 MED ORDER — SODIUM CHLORIDE 0.9 % IV SOLN
INTRAVENOUS | Status: AC
  Filled 2013-01-21: qty 500

## 2013-01-21 MED ORDER — DEXAMETHASONE SODIUM PHOSPHATE 4 MG/ML IJ SOLN
INTRAMUSCULAR | Status: DC | PRN
  Administered 2013-01-21: 20 mg via INTRAVENOUS

## 2013-01-21 MED ORDER — ARTIFICIAL TEARS OP OINT
TOPICAL_OINTMENT | OPHTHALMIC | Status: DC | PRN
  Administered 2013-01-21: 1 via OPHTHALMIC

## 2013-01-21 MED ORDER — MENTHOL 3 MG MT LOZG: 1.0000 | LOZENGE | OROMUCOSAL | Status: DC | PRN

## 2013-01-21 MED ORDER — DIAZEPAM 5 MG PO TABS
5.0000 mg | ORAL_TABLET | Freq: Four times a day (QID) | ORAL | Status: DC | PRN
  Administered 2013-01-25 – 2013-01-28 (×6): 5 mg via ORAL
  Filled 2013-01-21 (×6): qty 1

## 2013-01-21 MED ORDER — LACTATED RINGERS IV SOLN
INTRAVENOUS | Status: DC
  Administered 2013-01-21: 23:00:00 via INTRAVENOUS

## 2013-01-21 MED ORDER — CEFAZOLIN SODIUM-DEXTROSE 2-3 GM-% IV SOLR
INTRAVENOUS | Status: AC
  Administered 2013-01-21: 2 g via INTRAVENOUS
  Filled 2013-01-21: qty 50

## 2013-01-21 MED ORDER — SODIUM CHLORIDE 0.9 % IJ SOLN
10.0000 mL | Freq: Two times a day (BID) | INTRAMUSCULAR | Status: DC
  Administered 2013-01-22 – 2013-01-29 (×7): 10 mL

## 2013-01-21 MED ORDER — NEOSTIGMINE METHYLSULFATE 1 MG/ML IJ SOLN
INTRAMUSCULAR | Status: DC | PRN
  Administered 2013-01-21: 3 mg via INTRAVENOUS

## 2013-01-21 MED ORDER — DOCUSATE SODIUM 100 MG PO CAPS
100.0000 mg | ORAL_CAPSULE | Freq: Two times a day (BID) | ORAL | Status: DC
  Administered 2013-01-21 – 2013-01-30 (×15): 100 mg via ORAL
  Filled 2013-01-21 (×20): qty 1

## 2013-01-21 MED ORDER — LACTATED RINGERS IV SOLN
INTRAVENOUS | Status: DC | PRN
  Administered 2013-01-21: 18:00:00 via INTRAVENOUS

## 2013-01-21 MED ORDER — ROCURONIUM BROMIDE 100 MG/10ML IV SOLN
INTRAVENOUS | Status: DC | PRN
  Administered 2013-01-21: 50 mg via INTRAVENOUS

## 2013-01-21 MED ORDER — GLYCOPYRROLATE 0.2 MG/ML IJ SOLN
INTRAMUSCULAR | Status: DC | PRN
  Administered 2013-01-21: 0.4 mg via INTRAVENOUS

## 2013-01-21 MED ORDER — HYDROMORPHONE HCL PF 1 MG/ML IJ SOLN
INTRAMUSCULAR | Status: AC
  Filled 2013-01-21: qty 1

## 2013-01-21 MED ORDER — ONDANSETRON HCL 4 MG/2ML IJ SOLN: 4.0000 mg | INTRAMUSCULAR | Status: DC | PRN

## 2013-01-21 MED ORDER — BUPIVACAINE-EPINEPHRINE PF 0.5-1:200000 % IJ SOLN
INTRAMUSCULAR | Status: DC | PRN
  Administered 2013-01-21: 10 mL

## 2013-01-21 MED ORDER — OXYCODONE HCL 5 MG/5ML PO SOLN: 5.0000 mg | Freq: Once | ORAL | Status: AC | PRN

## 2013-01-21 MED ORDER — MIDAZOLAM HCL 5 MG/5ML IJ SOLN
INTRAMUSCULAR | Status: DC | PRN
  Administered 2013-01-21: 2 mg via INTRAVENOUS

## 2013-01-21 MED ORDER — HYDROMORPHONE HCL PF 1 MG/ML IJ SOLN
0.2500 mg | INTRAMUSCULAR | Status: DC | PRN
  Administered 2013-01-21 (×2): 0.5 mg via INTRAVENOUS

## 2013-01-21 MED ORDER — ALUM & MAG HYDROXIDE-SIMETH 200-200-20 MG/5ML PO SUSP
30.0000 mL | Freq: Four times a day (QID) | ORAL | Status: DC | PRN
  Filled 2013-01-21: qty 30

## 2013-01-21 MED ORDER — MORPHINE SULFATE (PF) 1 MG/ML IV SOLN
INTRAVENOUS | Status: AC
  Administered 2013-01-21: 22:00:00
  Filled 2013-01-21: qty 25

## 2013-01-21 MED ORDER — MUPIROCIN 2 % EX OINT
1.0000 "application " | TOPICAL_OINTMENT | Freq: Two times a day (BID) | CUTANEOUS | Status: AC
  Administered 2013-01-21 – 2013-01-25 (×7): 1 via NASAL
  Filled 2013-01-21 (×2): qty 22

## 2013-01-21 MED ORDER — ACETAMINOPHEN 650 MG RE SUPP: 650.0000 mg | RECTAL | Status: DC | PRN

## 2013-01-21 MED ORDER — CHLORHEXIDINE GLUCONATE CLOTH 2 % EX PADS
6.0000 | MEDICATED_PAD | Freq: Every day | CUTANEOUS | Status: AC
  Administered 2013-01-21 – 2013-01-25 (×5): 6 via TOPICAL

## 2013-01-21 MED ORDER — OXYCODONE-ACETAMINOPHEN 5-325 MG PO TABS
1.0000 | ORAL_TABLET | ORAL | Status: DC | PRN
  Administered 2013-01-23: 2 via ORAL
  Administered 2013-01-25 (×2): 1 via ORAL
  Administered 2013-01-26 – 2013-01-28 (×9): 2 via ORAL
  Administered 2013-01-29 (×3): 1 via ORAL
  Administered 2013-01-29 (×3): 2 via ORAL
  Filled 2013-01-21 (×4): qty 2
  Filled 2013-01-21: qty 1
  Filled 2013-01-21: qty 2
  Filled 2013-01-21 (×3): qty 1
  Filled 2013-01-21 (×4): qty 2
  Filled 2013-01-21: qty 1
  Filled 2013-01-21 (×5): qty 2

## 2013-01-21 MED ORDER — BACITRACIN 50000 UNITS IM SOLR
INTRAMUSCULAR | Status: AC
  Filled 2013-01-21: qty 1

## 2013-01-21 MED ORDER — PHENOL 1.4 % MT LIQD: 1.0000 | OROMUCOSAL | Status: DC | PRN

## 2013-01-21 MED ORDER — CEFAZOLIN SODIUM-DEXTROSE 2-3 GM-% IV SOLR
2.0000 g | Freq: Three times a day (TID) | INTRAVENOUS | Status: AC
  Administered 2013-01-22 (×2): 2 g via INTRAVENOUS
  Filled 2013-01-21 (×4): qty 50

## 2013-01-21 MED ORDER — FENTANYL CITRATE 0.05 MG/ML IJ SOLN
INTRAMUSCULAR | Status: DC | PRN
  Administered 2013-01-21 (×5): 50 ug via INTRAVENOUS

## 2013-01-21 MED ORDER — EPHEDRINE SULFATE 50 MG/ML IJ SOLN
INTRAMUSCULAR | Status: DC | PRN
  Administered 2013-01-21: 5 mg via INTRAVENOUS
  Administered 2013-01-21: 10 mg via INTRAVENOUS

## 2013-01-21 MED ORDER — MORPHINE SULFATE (PF) 1 MG/ML IV SOLN
INTRAVENOUS | Status: DC
  Administered 2013-01-21: 4.5 mg via INTRAVENOUS
  Administered 2013-01-22 (×2): 10.5 mg via INTRAVENOUS
  Administered 2013-01-22: 14:00:00 via INTRAVENOUS
  Administered 2013-01-22: 4 mg via INTRAVENOUS
  Administered 2013-01-22: 1.3 mg via INTRAVENOUS
  Administered 2013-01-23 (×2): via INTRAVENOUS
  Administered 2013-01-23: 9.6 mg via INTRAVENOUS
  Administered 2013-01-23: 19.5 mg via INTRAVENOUS
  Administered 2013-01-23 (×2): 1.5 mg via INTRAVENOUS
  Administered 2013-01-23: 24 mg via INTRAVENOUS
  Administered 2013-01-24: 8.1 mg via INTRAVENOUS
  Administered 2013-01-24: 4.47 mg via INTRAVENOUS
  Administered 2013-01-24: 10.09 mg via INTRAVENOUS
  Administered 2013-01-24: 13:00:00 via INTRAVENOUS
  Administered 2013-01-24: 11.39 mg via INTRAVENOUS
  Administered 2013-01-24: 12.1 mg via INTRAVENOUS
  Administered 2013-01-24: 1.5 mg via INTRAVENOUS
  Administered 2013-01-25: 6 mg via INTRAVENOUS
  Administered 2013-01-25: 2.43 mg via INTRAVENOUS
  Administered 2013-01-25: 7.5 mg via INTRAVENOUS
  Administered 2013-01-25: 5 mg via INTRAVENOUS
  Filled 2013-01-21 (×6): qty 25

## 2013-01-21 MED ORDER — 0.9 % SODIUM CHLORIDE (POUR BTL) OPTIME
TOPICAL | Status: DC | PRN
  Administered 2013-01-21: 1000 mL

## 2013-01-21 MED ORDER — PROPOFOL 10 MG/ML IV BOLUS
INTRAVENOUS | Status: DC | PRN
  Administered 2013-01-21: 120 mg via INTRAVENOUS

## 2013-01-21 MED ORDER — ONDANSETRON HCL 4 MG/2ML IJ SOLN
INTRAMUSCULAR | Status: DC | PRN
  Administered 2013-01-21: 4 mg via INTRAVENOUS

## 2013-01-21 MED ORDER — MORPHINE SULFATE 2 MG/ML IJ SOLN
1.0000 mg | INTRAMUSCULAR | Status: DC | PRN
  Administered 2013-01-28 (×2): 2 mg via INTRAVENOUS
  Filled 2013-01-21 (×2): qty 1

## 2013-01-21 MED ORDER — ACETAMINOPHEN 325 MG PO TABS: 650.0000 mg | ORAL_TABLET | ORAL | Status: DC | PRN

## 2013-01-21 MED ORDER — SODIUM CHLORIDE 0.9 % IJ SOLN
10.0000 mL | INTRAMUSCULAR | Status: DC | PRN
  Administered 2013-01-21: 10 mL

## 2013-01-21 MED ORDER — ONDANSETRON HCL 4 MG/2ML IJ SOLN: 4.0000 mg | Freq: Four times a day (QID) | INTRAMUSCULAR | Status: DC | PRN

## 2013-01-21 MED ORDER — SODIUM CHLORIDE 0.9 % IR SOLN
Status: DC | PRN
  Administered 2013-01-21: 19:00:00

## 2013-01-21 SURGICAL SUPPLY — 81 items
BAG DECANTER FOR FLEXI CONT (MISCELLANEOUS) ×2 IMPLANT
BENZOIN TINCTURE PRP APPL 2/3 (GAUZE/BANDAGES/DRESSINGS) ×2 IMPLANT
BIT DRILL NEURO 2X3.1 SFT TUCH (MISCELLANEOUS) ×1 IMPLANT
BLADE SURG 11 STRL SS (BLADE) ×2 IMPLANT
BLADE SURG ROTATE 9660 (MISCELLANEOUS) IMPLANT
BLADE ULTRA TIP 2M (BLADE) IMPLANT
BRUSH SCRUB EZ 1% IODOPHOR (MISCELLANEOUS) IMPLANT
BUR MATCHSTICK NEURO 3.0 LAGG (BURR) ×2 IMPLANT
CANISTER SUCTION 2500CC (MISCELLANEOUS) ×4 IMPLANT
CLIP TI MEDIUM 6 (CLIP) IMPLANT
CLOTH BEACON ORANGE TIMEOUT ST (SAFETY) ×2 IMPLANT
CONT SPEC 4OZ CLIKSEAL STRL BL (MISCELLANEOUS) ×4 IMPLANT
COTTONBALL LRG STERILE PKG (GAUZE/BANDAGES/DRESSINGS) IMPLANT
COVER MAYO STAND STRL (DRAPES) ×2 IMPLANT
DRAIN SNY 7 FPER (WOUND CARE) IMPLANT
DRAPE CAMERA VIDEO/LASER (DRAPES) IMPLANT
DRAPE LAPAROTOMY 100X72 PEDS (DRAPES) ×2 IMPLANT
DRAPE LAPAROTOMY 100X72X124 (DRAPES) IMPLANT
DRAPE LONG LASER MIC (DRAPES) IMPLANT
DRAPE MICROSCOPE LEICA (MISCELLANEOUS) ×2 IMPLANT
DRAPE POUCH INSTRU U-SHP 10X18 (DRAPES) ×2 IMPLANT
DRAPE SURG 17X23 STRL (DRAPES) ×8 IMPLANT
DRILL NEURO 2X3.1 SOFT TOUCH (MISCELLANEOUS) ×2
ELECT BLADE 4.0 EZ CLEAN MEGAD (MISCELLANEOUS) ×2
ELECT REM PT RETURN 9FT ADLT (ELECTROSURGICAL) ×2
ELECTRODE BLDE 4.0 EZ CLN MEGD (MISCELLANEOUS) ×1 IMPLANT
ELECTRODE REM PT RTRN 9FT ADLT (ELECTROSURGICAL) ×1 IMPLANT
EVACUATOR 1/8 PVC DRAIN (DRAIN) ×2 IMPLANT
EVACUATOR SILICONE 100CC (DRAIN) IMPLANT
GAUZE SPONGE 4X4 16PLY XRAY LF (GAUZE/BANDAGES/DRESSINGS) ×2 IMPLANT
GLOVE BIO SURGEON STRL SZ 6.5 (GLOVE) ×4 IMPLANT
GLOVE BIO SURGEON STRL SZ7 (GLOVE) ×4 IMPLANT
GLOVE BIO SURGEON STRL SZ8 (GLOVE) ×2 IMPLANT
GLOVE BIO SURGEON STRL SZ8.5 (GLOVE) ×2 IMPLANT
GLOVE ECLIPSE 7.5 STRL STRAW (GLOVE) ×2 IMPLANT
GLOVE EXAM NITRILE LRG STRL (GLOVE) IMPLANT
GLOVE EXAM NITRILE MD LF STRL (GLOVE) IMPLANT
GLOVE EXAM NITRILE XL STR (GLOVE) IMPLANT
GLOVE EXAM NITRILE XS STR PU (GLOVE) IMPLANT
GLOVE INDICATOR 6.5 STRL GRN (GLOVE) ×2 IMPLANT
GLOVE INDICATOR 7.5 STRL GRN (GLOVE) ×2 IMPLANT
GLOVE SS BIOGEL STRL SZ 8 (GLOVE) ×1 IMPLANT
GLOVE SUPERSENSE BIOGEL SZ 8 (GLOVE) ×1
GOWN BRE IMP SLV AUR LG STRL (GOWN DISPOSABLE) ×6 IMPLANT
GOWN BRE IMP SLV AUR XL STRL (GOWN DISPOSABLE) ×6 IMPLANT
KIT BASIN OR (CUSTOM PROCEDURE TRAY) ×2 IMPLANT
KIT ROOM TURNOVER OR (KITS) ×2 IMPLANT
NEEDLE HYPO 21X1.5 SAFETY (NEEDLE) ×2 IMPLANT
NEEDLE HYPO 22GX1.5 SAFETY (NEEDLE) ×2 IMPLANT
NEEDLE SPNL 18GX3.5 QUINCKE PK (NEEDLE) ×2 IMPLANT
NS IRRIG 1000ML POUR BTL (IV SOLUTION) ×2 IMPLANT
PACK LAMINECTOMY NEURO (CUSTOM PROCEDURE TRAY) ×2 IMPLANT
PAD ARMBOARD 7.5X6 YLW CONV (MISCELLANEOUS) ×6 IMPLANT
PAD EYE OVAL STERILE LF (GAUZE/BANDAGES/DRESSINGS) IMPLANT
PATTIES SURGICAL .25X.25 (GAUZE/BANDAGES/DRESSINGS) IMPLANT
PATTIES SURGICAL .5 X.5 (GAUZE/BANDAGES/DRESSINGS) IMPLANT
PATTIES SURGICAL .5 X3 (DISPOSABLE) IMPLANT
PATTIES SURGICAL 1/4 X 3 (GAUZE/BANDAGES/DRESSINGS) IMPLANT
PATTIES SURGICAL 1X1 (DISPOSABLE) IMPLANT
RUBBERBAND STERILE (MISCELLANEOUS) ×4 IMPLANT
SPONGE GAUZE 4X4 12PLY (GAUZE/BANDAGES/DRESSINGS) ×2 IMPLANT
SPONGE LAP 4X18 X RAY DECT (DISPOSABLE) IMPLANT
SPONGE NEURO XRAY DETECT 1X3 (DISPOSABLE) IMPLANT
STAPLER SKIN PROX WIDE 3.9 (STAPLE) ×2 IMPLANT
STRIP CLOSURE SKIN 1/2X4 (GAUZE/BANDAGES/DRESSINGS) ×2 IMPLANT
SUT ETHILON 3 0 FSL (SUTURE) ×2 IMPLANT
SUT NURALON 4 0 TR CR/8 (SUTURE) ×2 IMPLANT
SUT PROLENE 6 0 BV (SUTURE) IMPLANT
SUT SILK 3 0 TIES 17X18 (SUTURE)
SUT SILK 3-0 18XBRD TIE BLK (SUTURE) IMPLANT
SUT VIC AB 1 CT1 18XBRD ANBCTR (SUTURE) ×2 IMPLANT
SUT VIC AB 1 CT1 8-18 (SUTURE) ×2
SUT VIC AB 2-0 CP2 18 (SUTURE) ×2 IMPLANT
SYR 20CC LL (SYRINGE) ×2 IMPLANT
SYR 20ML ECCENTRIC (SYRINGE) ×2 IMPLANT
TAPE CLOTH SURG 4X10 WHT LF (GAUZE/BANDAGES/DRESSINGS) ×2 IMPLANT
TAPE STRIPS DRAPE STRL (GAUZE/BANDAGES/DRESSINGS) ×2 IMPLANT
TOWEL OR 17X24 6PK STRL BLUE (TOWEL DISPOSABLE) ×2 IMPLANT
TOWEL OR 17X26 10 PK STRL BLUE (TOWEL DISPOSABLE) ×2 IMPLANT
TRAY FOLEY CATH 14FRSI W/METER (CATHETERS) IMPLANT
WATER STERILE IRR 1000ML POUR (IV SOLUTION) ×2 IMPLANT

## 2013-01-21 NOTE — Anesthesia Preprocedure Evaluation (Addendum)
Anesthesia Evaluation  Patient identified by MRN, date of birth, ID band Patient awake    Reviewed: Allergy & Precautions, H&P , NPO status , Patient's Chart, lab work & pertinent test results  Airway Mallampati: II TM Distance: >3 FB Neck ROM: Limited    Dental  (+) Teeth Intact and Dental Advisory Given   Pulmonary sleep apnea , COPD COPD inhaler,  + rhonchi   Pulmonary exam normal       Cardiovascular Exercise Tolerance: Poor hypertension, Pt. on medications Rhythm:Regular Rate:Normal     Neuro/Psych Bilateral Leg Weakness  Neuromuscular disease    GI/Hepatic GERD-  Medicated and Controlled,(+)     substance abuse  alcohol use,   Endo/Other  Hypothyroidism Chronic hyponatremia likely secondary to alcohol abuse. Possible SIADH  Renal/GU      Musculoskeletal  (+) Arthritis -, Osteoarthritis,    Abdominal Normal abdominal exam  (+)   Peds  Hematology   Anesthesia Other Findings Pt S/P radical Neck Dissection w/ Radiation to Left Neck and Mandibular area.2011  Reproductive/Obstetrics                         Anesthesia Physical Anesthesia Plan  ASA: IV  Anesthesia Plan: General   Post-op Pain Management:    Induction: Intravenous  Airway Management Planned: Oral ETT  Additional Equipment: Arterial line  Intra-op Plan:   Post-operative Plan: Possible Post-op intubation/ventilation and Extubation in OR  Informed Consent: I have reviewed the patients History and Physical, chart, labs and discussed the procedure including the risks, benefits and alternatives for the proposed anesthesia with the patient or authorized representative who has indicated his/her understanding and acceptance.   Dental advisory given  Plan Discussed with: CRNA  Anesthesia Plan Comments: (Pt S/P Radical Neck Dissection w/ Radiation Rx 2011)       Anesthesia Quick Evaluation

## 2013-01-21 NOTE — Progress Notes (Signed)
Per Dr. [name], patient can take meds with sips of water, BP 142/88.[name] Marin RN

## 2013-01-21 NOTE — Transfer of Care (Signed)
Immediate Anesthesia Transfer of Care Note  Patient: [name] [name]  Procedure(s) Performed: Procedure(s): THORACIC LAMINECTOMY FOR TUMOR (N/A)  Patient Location:[facility]  Anesthesia Type:General  Level of Consciousness: awake, alert , oriented, patient cooperative and responds to stimulation  Airway & Oxygen Therapy: Patient Spontanous Breathing and Patient connected to nasal cannula oxygen  Post-op Assessment: Report given to PACU RN, Post -op Vital signs reviewed and stable and Patient moving all extremities X 4  Post vital signs: Reviewed and stable  Complications: No apparent anesthesia complications

## 2013-01-21 NOTE — Progress Notes (Signed)
Report given to Arnoldo Morale RN at [facility], patient will be transported to [facility] via [facility]- Hulda Marin RN

## 2013-01-21 NOTE — Progress Notes (Signed)
Patient ID: [name] [name], male   DOB: July 17, 1956, 57 y.o.   MRN: [id number] Subjective:  The patient is alert and pleasant. He looks well.  Objective: Vital signs in last 24 hours: Temp:  [97.4 F (36.3 C)-98.8 F (37.1 C)] 98.2 F (36.8 C) (06/05 1703) Pulse Rate:  [81-105] 81 (06/05 1703) Resp:  [14-18] 16 (06/05 1703) BP: (142-180)/(78-100) 176/95 mmHg (06/05 1703) SpO2:  [10 %-99 %] 99 % (06/05 1703)  Intake/Output from previous day: 06/04 0701 - 06/05 0700 In: 952.5 [P.O.:600; I.V.:40; Blood:312.5] Out: 2500 [Urine:2500] Intake/Output this shift: Total I/O In: 1950 [I.V.:1000; Blood:700; IV Piggyback:250] Out: 150 [Blood:150]  Physical exam the patient is alert and pleasant. He is moving his lower extremities well.  Lab Results:  Recent Labs  01/20/13 0806 01/21/13 0420  WBC 6.1 5.1  HGB 7.5* 8.9*  HCT 22.1* 26.4*  PLT 246 276   BMET  Recent Labs  01/20/13 0806 01/21/13 0420  NA 119* 122*  K 4.9 4.6  CL 83* 84*  CO2 29 29  GLUCOSE 93 131*  BUN 8.5 9  CREATININE 0.9 0.89  CALCIUM 9.2 9.6    Studies/Results: Mr Thoracic Spine W Wo Contrast  01/20/2013   *RADIOLOGY REPORT*  Clinical Data: Squamous cell carcinoma with metastatic disease. Back pain and bilateral leg numbness.  Evaluate for metastatic spread.  MRI THORACIC SPINE WITHOUT AND WITH CONTRAST  Technique:  Multiplanar and multiecho pulse sequences of the thoracic spine were obtained without and with intravenous contrast.  Contrast: 17mL MULTIHANCE GADOBENATE DIMEGLUMINE 529 MG/ML IV SOLN  Comparison: CT 01/05/2013.  Findings: Counting was performed from the craniocervical junction. There is metastatic infiltration of T1-T3 which is extensive.  No pathologic compression fracture. Loss of the subarachnoid space at these levels with abnormal enhancing soft tissue around the thecal sac and cord anteriorly and posteriorly compatible with extraosseous extension of tumor. Complete effacement of the  subarachnoid space with transverse and AP compression of the thoracic cord.  Extension into both neural foramina is present at T2-T3 and into the right neural foramen at T3-T4 and T1-T2.  Tumor extends into the paravertebral fat.  Pulmonary metastatic nodule in the posterior right upper lobe is again noted.  No convincing evidence of drop metastases in the lower thoracic spine. There is diffuse infiltration of the posterior elements from T1-T3. The bony metastatic disease also involves T4 and T5 although to a lesser extent and T1-T3. Heterogeneous marrow signal is present throughout, likely associated with chronic disease.  Mid thoracic compression fractures are present which appear chronic, without marrow edema.  Minimal thoracic spine degenerative disease is present with a few shallow disc bulges, most pronounced at T9-T10. Left interpolar renal cyst incidentally noted.  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  This encircles the thecal sac and produces severe stenosis with compression of the thoracic cord and complete effacement of the subarachnoid space.  Right greater than left foraminal invasion by tumor. Bony metastatic disease is evident from T1-T5.  There may be another small focus in the dorsal aspect of T7.  Critical Value/emergent results were called by telephone at the time of interpretation on 01/20/2013 at 1828 hours to Dr. [name], who verbally acknowledged these results.   Original Report Authenticated By: [name], M.D.    Assessment/Plan: The patient is doing well.  LOS: 1 day     Quintrell Baze D 01/21/2013, 9:44 PM

## 2013-01-21 NOTE — Anesthesia Postprocedure Evaluation (Signed)
  Anesthesia Post-op Note  Patient: [name] [name]  Procedure(s) Performed: Procedure(s): THORACIC LAMINECTOMY FOR TUMOR (N/A)  Patient Location: PACU  Anesthesia Type:General  Level of Consciousness: awake and alert   Airway and Oxygen Therapy: Patient Spontanous Breathing and Patient connected to nasal cannula oxygen  Post-op Pain: moderate  Post-op Assessment: Post-op Vital signs reviewed, Patient's Cardiovascular Status Stable, Respiratory Function Stable, Patent Airway and No signs of Nausea or vomiting  Post-op Vital Signs: Reviewed and stable  Complications: No apparent anesthesia complications

## 2013-01-21 NOTE — Progress Notes (Signed)
The lab from [facility] called to report that the patient's surgical PCR screening was positive for MRSA. Contact precautions initiated.

## 2013-01-21 NOTE — Progress Notes (Signed)
I spoke with the patient this morning. I answered all his questions regarding surgery. He wants to proceed with the operation. We will need to transfer him to [facility] [place] this morning for surgery this afternoon.

## 2013-01-21 NOTE — Progress Notes (Signed)
Dr. Sampson Goon called for a sign out given report

## 2013-01-21 NOTE — Progress Notes (Signed)
PCA SET UP PT INSTRUCTED ON USE WILL NEED REINFORCEMENT

## 2013-01-21 NOTE — Preoperative (Signed)
Beta Blockers   Reason not to administer Beta Blockers:Not Applicable 

## 2013-01-21 NOTE — Addendum Note (Signed)
Encounter addended by: Delynn Flavin, RN on: 01/21/2013  5:24 PM<BR>     Documentation filed: Charges VN

## 2013-01-21 NOTE — Progress Notes (Signed)
[facility] INPATIENT PROGRESS NOTE  Name: [name] [name]      MRN: [id number]    Location: 1307/1307-01  Date: 01/21/2013 Time:8:41 AM   Subjective: Interval History:[name] [name] complained still numbness from mid back down.  He still has weakness in the legs.  He needs 2 cans to ambulate and with assistance.  He denied fever, SOB, chest pain, abd pain, bleeding symptoms, incontinence, seizure, agitation.   Objective: Vital signs in last 24 hours: Temp:  [97.4 F (36.3 C)-99.5 F (37.5 C)] 98.5 F (36.9 C) (06/05 0600) Pulse Rate:  [75-105] 102 (06/05 0616) Resp:  [14-20] 14 (06/05 0600) BP: (115-180)/(75-100) 154/98 mmHg (06/05 0616) SpO2:  [96 %-100 %] 97 % (06/05 0600) Weight:  [184 lb 6.4 oz (83.643 kg)-185 lb (83.915 kg)] 185 lb (83.915 kg) (06/04 1157)     PHYSICAL EXAM: Gen: Well-nourished man, in no acute distress. Eyes: No scleral icterus or jaundice. ENT: There was no oropharyngeal lesions. Neck was supple without thyromegaly. Lymphatics: Negative for cervical, supraclavicular, axillary, or inguinal adenopathy.  Respiratory: Lungs were clear bilaterally without wheezing or crackles. Cardiovascular: normal heart rate and rhythm; S1/S2; without murmur, rubs, or gallop. There was no pedal edema. GI: Abdomen was soft, flat, nontender, nondistended, without organomegaly. Musculoskeletal exam: No spinal tenderness on palpation of vertebral spine. Skin exam was without ecchymosis, petechiae. Neuro exam again showed decreased light touch sensation in the lower back to his feet.  Both upper and lower extremities bilaterraly was 5/5. Patient was alert and oriented. Attention was good. Language was appropriate. Mood was normal without depression. Speech was not pressured. Thought content was not tangential.        Studies/Results: Results for orders placed during the hospital encounter of 01/20/13 (from the past 48 hour(s))  PREPARE RBC (CROSSMATCH)     Status: None   Collection Time    01/20/13 12:30 PM      Result Value Range   Order Confirmation ORDER PROCESSED BY BLOOD BANK    OSMOLALITY     Status: Abnormal   Collection Time    01/20/13  1:15 PM      Result Value Range   Osmolality 248 (*) 275 - 300 mOsm/kg   Comment: Result repeated and verified.     CRITICAL RESULT CALLED TO, READ BACK BY AND VERIFIED WITH:     SAUVEL/[id number]  FERRITIN     Status: None   Collection Time    01/20/13  1:15 PM      Result Value Range   Ferritin 110  22 - 322 ng/mL  IRON AND TIBC     Status: Abnormal   Collection Time    01/20/13  1:15 PM      Result Value Range   Iron 31 (*) 42 - 135 ug/dL   TIBC 409  811 - 914 ug/dL   Saturation Ratios 11 (*) 20 - 55 %   UIBC 245  125 - 400 ug/dL  VITAMIN N82     Status: None   Collection Time    01/20/13  1:15 PM      Result Value Range   Vitamin B-12 910  211 - 911 pg/mL  TYPE AND SCREEN     Status: None   Collection Time    01/20/13  1:15 PM      Result Value Range   ABO/RH(D) A POS     Antibody Screen NEG     Sample Expiration 01/23/2013     Unit Number  [id number]     Blood Component Type RBC LR PHER2     Unit division 00     Status of Unit ISSUED,FINAL     Transfusion Status OK TO TRANSFUSE     Crossmatch Result Compatible     Unit Number [id number]     Blood Component Type RED CELLS,LR     Unit division 00     Status of Unit ALLOCATED     Transfusion Status OK TO TRANSFUSE     Crossmatch Result Compatible     Unit Number [id number]     Blood Component Type RED CELLS,LR     Unit division 00     Status of Unit ALLOCATED     Transfusion Status OK TO TRANSFUSE     Crossmatch Result Compatible    ABO/RH     Status: None   Collection Time    01/20/13  1:15 PM      Result Value Range   ABO/RH(D) A POS    OSMOLALITY, URINE     Status: Abnormal   Collection Time    01/20/13  6:53 PM      Result Value Range   Osmolality, Ur 137 (*) 390 - 1090 mOsm/kg  SODIUM, URINE, RANDOM      Status: None   Collection Time    01/20/13  6:53 PM      Result Value Range   Sodium, Ur 32    BASIC METABOLIC PANEL     Status: Abnormal   Collection Time    01/21/13  4:20 AM      Result Value Range   Sodium 122 (*) 135 - 145 mEq/L   Potassium 4.6  3.5 - 5.1 mEq/L   Chloride 84 (*) 96 - 112 mEq/L   CO2 29  19 - 32 mEq/L   Glucose, Bld 131 (*) 70 - 99 mg/dL   BUN 9  6 - 23 mg/dL   Creatinine, Ser 5.28  0.50 - 1.35 mg/dL   Calcium 9.6  8.4 - 41.3 mg/dL   GFR calc non Af Amer >90  >90 mL/min   GFR calc Af Amer >90  >90 mL/min   Comment:            The eGFR has been calculated     using the CKD EPI equation.     This calculation has not been     validated in all clinical     situations.     eGFR's persistently     <90 mL/min signify     possible Chronic Kidney Disease.  CBC     Status: Abnormal   Collection Time    01/21/13  4:20 AM      Result Value Range   WBC 5.1  4.0 - 10.5 K/uL   RBC 3.08 (*) 4.22 - 5.81 MIL/uL   Hemoglobin 8.9 (*) 13.0 - 17.0 g/dL   HCT 24.4 (*) 01.0 - 27.2 %   MCV 85.7  78.0 - 100.0 fL   MCH 28.9  26.0 - 34.0 pg   MCHC 33.7  30.0 - 36.0 g/dL   RDW 53.6  64.4 - 03.4 %   Platelets 276  150 - 400 K/uL  PREPARE RBC (CROSSMATCH)     Status: None   Collection Time    01/21/13  8:00 AM      Result Value Range   Order Confirmation ORDER PROCESSED BY BLOOD BANK     Mr Thoracic Spine W Wo Contrast  01/20/2013   *  RADIOLOGY REPORT*  Clinical Data: Squamous cell carcinoma with metastatic disease. Back pain and bilateral leg numbness.  Evaluate for metastatic spread.  MRI THORACIC SPINE WITHOUT AND WITH CONTRAST  Technique:  Multiplanar and multiecho pulse sequences of the thoracic spine were obtained without and with intravenous contrast.  Contrast: 17mL MULTIHANCE GADOBENATE DIMEGLUMINE 529 MG/ML IV SOLN  Comparison: CT 01/05/2013.  Findings: Counting was performed from the craniocervical junction. There is metastatic infiltration of T1-T3 which is extensive.   No pathologic compression fracture. Loss of the subarachnoid space at these levels with abnormal enhancing soft tissue around the thecal sac and cord anteriorly and posteriorly compatible with extraosseous extension of tumor. Complete effacement of the subarachnoid space with transverse and AP compression of the thoracic cord.  Extension into both neural foramina is present at T2-T3 and into the right neural foramen at T3-T4 and T1-T2.  Tumor extends into the paravertebral fat.  Pulmonary metastatic nodule in the posterior right upper lobe is again noted.  No convincing evidence of drop metastases in the lower thoracic spine. There is diffuse infiltration of the posterior elements from T1-T3. The bony metastatic disease also involves T4 and T5 although to a lesser extent and T1-T3. Heterogeneous marrow signal is present throughout, likely associated with chronic disease.  Mid thoracic compression fractures are present which appear chronic, without marrow edema.  Minimal thoracic spine degenerative disease is present with a few shallow disc bulges, most pronounced at T9-T10. Left interpolar renal cyst incidentally noted.  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  This encircles the thecal sac and produces severe stenosis with compression of the thoracic cord and complete effacement of the subarachnoid space.  Right greater than left foraminal invasion by tumor. Bony metastatic disease is evident from T1-T5.  There may be another small focus in the dorsal aspect of T7.  Critical Value/emergent results were called by telephone at the time of interpretation on 01/20/2013 at 1828 hours to Dr. [name], who verbally acknowledged these results.   Original Report Authenticated By: [name], M.D.     MEDICATIONS: reviewed.     PROBLEM LIST:  1.  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  2.  Acute  cord compression. 3.  Anemia 4.  Hyponatremia. 5.  Hypertension. 6.  EtOH abuse. 7.  Hypothyroidism. 8.  BPH 9.  Depression, anxiety.     IMPRESSION:  Acute cord compression in a patient with metastatic head/neck cancer.  He does have lung met.  However, the metastatic disease burden is not too high.  He has been having metastatic disease for at least 2 years.  Therefore, his life expectancy is expected to be more than 6 months.   His anemia and hyponatremia are most likely due to metastatic disease as well.  Anemia work up showed no obvious concern for GI bleed, or iron deficiency anemia.  His pRBC transfusion did appropriately increase his Hgb which has been stable.  With fluid restriction, his hyponatremia has slightly improved.  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     Assessment/Plan:  - Continue Dexamethasone for cord compression. - Appreciate eval by NeuroSurg Dr. Lovell Sheehan, Rad Onc Drs. Kinnard and Squire for thoracic laminectomy for debulking of the epidural tumor and decompression of the spinal cord followed by radiation.  We will discuss palliative chemo once he recovers from these.  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  Once work up confirms  SIADH, we may consider other meds.    FULL CODE.

## 2013-01-21 NOTE — Op Note (Signed)
Brief history: The patient is a 57 year old white male with a history of throat cancer/squamous cell cancer which is metastatic. He's had progressive back pain and in fact has become and able to angulate. He was worked up with a thoracic MRI which demonstrated a T1-T3 epidural metastasis with spinal cord compression. I discussed situation with the patient and his wife. We discussed the various treatment options including surgery. The patient and his family has weighed the risks, benefits, and alternatives surgery and decided proceed with a thoracic laminectomy for resection of tumor.  Preop diagnosis: Thoracic epidural metastasis, spinal cord compression, paraparesis  Postop diagnosis: Same  Procedure: T1-T3 laminectomy for debulking of a thoracic epidural tumor  Surgeon: Dr. Delma Officer  Assistant: Dr. Marikay Alar  Anesthesia: Gen. endotracheal  Estimated blood loss: 100 cc  Specimens: Epidural tumor  Complications: None  Drains: One epidural Hemovac drain  Description of procedure: The patient was brought to the operating room by the anesthesia team. General endotracheal anesthesia was induced. I applied the Mayfield 3 point headrest the patient's calvarium. The patient was turned to the prone position on the chest rolls. The patient's suboccipital region was then shaved with clippers and this region as well as the upper thorax was then prepared with Betadine scrub and Betadine solution. Sterile drapes were applied. I then injected the area to be incised with Marcaine with epinephrine solution. I used a scalpel to make a linear midline incision over the upper thoracic region. I used electrocautery to perform a bilateral subperiosteal dissection exposing the spinous process and lamina of T1-T3. We obtained intraoperative radiograph to confirm our location. We inserted the cerebellar and McCullough retractor for exposure. I then used the Leksell nodule were to remove the spinous process at T1,  T2 and T3. I then used a high-speed drill to drill away the lamina at T1, T2 and T3. I then used the small Kerrison punches to remove the ligamentum flavum at C7-T1, T1-T2, T2-3 and T3-4. This exposed the epidural tumor overlying the thecal sac/spinal cord. I carefully used a micro-instruments to dissect the tumor away from the dura. Remove the tumor using the Sonapet, decompressing the spinal cord. After I was satisfied with the decompression I obtained hemostasis using bipolar cautery. We then irrigated the wound out with bacitracin solution. I then placed a medium Hemovac drain in the epidural space and tunneled it out through separate stab wound. We then removed the retractors. I then reapproximated patient's thoracic fascia with interrupted 0 Vicryl suture. I then reapproximated the subcutaneous tissue with interrupted 2-0 Vicryl suture. I then reapproximated the skin with Steri-Strips and benzoin. The was then coated with bacitracin ointment. A sterile dressing was applied. The drapes were removed. The patient was subsequently returned to the supine position. I then removed the Mayfield 3 point headrest from his calvarium. The patient was subsequently extubated by the anesthesia team and transported to the post anesthesia care he in stable condition. All sponge instrument and needle counts were reportedly correct at the end of this case.

## 2013-01-21 NOTE — Progress Notes (Signed)
Subjective:  The patient is alert and pleasant.  Objective: Vital signs in last 24 hours: Temp:  [97.4 F (36.3 C)-99.5 F (37.5 C)] 98.2 F (36.8 C) (06/05 1703) Pulse Rate:  [81-105] 81 (06/05 1703) Resp:  [14-18] 16 (06/05 1703) BP: (115-180)/(75-100) 176/95 mmHg (06/05 1703) SpO2:  [10 %-99 %] 99 % (06/05 1703)  Intake/Output from previous day: 06/04 0701 - 06/05 0700 In: 952.5 [P.O.:600; I.V.:40; Blood:312.5] Out: 2500 [Urine:2500] Intake/Output this shift:    Physical exam the patient is alert and oriented. His lower extremity strength remains fairly normal . He continues to have lower extremity and thorax numbness.  Lab Results:  Recent Labs  01/20/13 0806 01/21/13 0420  WBC 6.1 5.1  HGB 7.5* 8.9*  HCT 22.1* 26.4*  PLT 246 276   BMET  Recent Labs  01/20/13 0806 01/21/13 0420  NA 119* 122*  K 4.9 4.6  CL 83* 84*  CO2 29 29  GLUCOSE 93 131*  BUN 8.5 9  CREATININE 0.9 0.89  CALCIUM 9.2 9.6    Studies/Results: Mr Thoracic Spine W Wo Contrast  01/20/2013   *RADIOLOGY REPORT*  Clinical Data: Squamous cell carcinoma with metastatic disease. Back pain and bilateral leg numbness.  Evaluate for metastatic spread.  MRI THORACIC SPINE WITHOUT AND WITH CONTRAST  Technique:  Multiplanar and multiecho pulse sequences of the thoracic spine were obtained without and with intravenous contrast.  Contrast: 17mL MULTIHANCE GADOBENATE DIMEGLUMINE 529 MG/ML IV SOLN  Comparison: CT 01/05/2013.  Findings: Counting was performed from the craniocervical junction. There is metastatic infiltration of T1-T3 which is extensive.  No pathologic compression fracture. Loss of the subarachnoid space at these levels with abnormal enhancing soft tissue around the thecal sac and cord anteriorly and posteriorly compatible with extraosseous extension of tumor. Complete effacement of the subarachnoid space with transverse and AP compression of the thoracic cord.  Extension into both neural foramina  is present at T2-T3 and into the right neural foramen at T3-T4 and T1-T2.  Tumor extends into the paravertebral fat.  Pulmonary metastatic nodule in the posterior right upper lobe is again noted.  No convincing evidence of drop metastases in the lower thoracic spine. There is diffuse infiltration of the posterior elements from T1-T3. The bony metastatic disease also involves T4 and T5 although to a lesser extent and T1-T3. Heterogeneous marrow signal is present throughout, likely associated with chronic disease.  Mid thoracic compression fractures are present which appear chronic, without marrow edema.  Minimal thoracic spine degenerative disease is present with a few shallow disc bulges, most pronounced at T9-T10. Left interpolar renal cyst incidentally noted.  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  This encircles the thecal sac and produces severe stenosis with compression of the thoracic cord and complete effacement of the subarachnoid space.  Right greater than left foraminal invasion by tumor. Bony metastatic disease is evident from T1-T5.  There may be another small focus in the dorsal aspect of T7.  Critical Value/emergent results were called by telephone at the time of interpretation on 01/20/2013 at 1828 hours to Dr. [name], who verbally acknowledged these results.   Original Report Authenticated By: [name], M.D.    Assessment/Plan: T1-T3 epidural metastasis, paraparesis: I have again discussed situation with the patient and also now with his wife. We have discussed the various treatment options including surgery versus palliative radiation. I have answered all her questions regarding surgery. We have discussed the risks, benefits,  alternatives, and likelihood of achieving our goals with surgery. They understand that the surgery is not curative and hopefully we'll decompress his spinal cord. They want to proceed with the  operation.  LOS: 1 day     Zakry Caso D 01/21/2013, 6:40 PM

## 2013-01-22 ENCOUNTER — Encounter: Payer: Self-pay | Admitting: Family Medicine

## 2013-01-22 ENCOUNTER — Inpatient Hospital Stay (HOSPITAL_COMMUNITY): Payer: Federal, State, Local not specified - PPO

## 2013-01-22 ENCOUNTER — Inpatient Hospital Stay (HOSPITAL_COMMUNITY): Payer: Federal, State, Local not specified - PPO | Admitting: Certified Registered"

## 2013-01-22 ENCOUNTER — Encounter (HOSPITAL_COMMUNITY)
Admission: AD | Disposition: A | Discharge status: Hospice | Payer: Self-pay | Source: Ambulatory Visit | Attending: Oncology

## 2013-01-22 ENCOUNTER — Encounter (HOSPITAL_COMMUNITY): Payer: Self-pay | Admitting: Certified Registered"

## 2013-01-22 DIAGNOSIS — C801 Malignant (primary) neoplasm, unspecified: Secondary | ICD-10-CM

## 2013-01-22 DIAGNOSIS — C7800 Secondary malignant neoplasm of unspecified lung: Secondary | ICD-10-CM

## 2013-01-22 DIAGNOSIS — R1310 Dysphagia, unspecified: Secondary | ICD-10-CM

## 2013-01-22 DIAGNOSIS — D649 Anemia, unspecified: Secondary | ICD-10-CM

## 2013-01-22 DIAGNOSIS — N400 Enlarged prostate without lower urinary tract symptoms: Secondary | ICD-10-CM

## 2013-01-22 DIAGNOSIS — F1010 Alcohol abuse, uncomplicated: Secondary | ICD-10-CM

## 2013-01-22 DIAGNOSIS — C760 Malignant neoplasm of head, face and neck: Secondary | ICD-10-CM

## 2013-01-22 DIAGNOSIS — R209 Unspecified disturbances of skin sensation: Secondary | ICD-10-CM

## 2013-01-22 DIAGNOSIS — E871 Hypo-osmolality and hyponatremia: Secondary | ICD-10-CM

## 2013-01-22 DIAGNOSIS — M549 Dorsalgia, unspecified: Secondary | ICD-10-CM

## 2013-01-22 HISTORY — PX: WOUND EXPLORATION: SHX6188

## 2013-01-22 LAB — BLOOD GAS, ARTERIAL
Drawn by: 39866
MECHVT: 600 mL
PEEP: 5 cmH2O
Patient temperature: 97.6
RATE: 14 resp/min
TCO2: 25.8 mmol/L (ref 0–100)
pCO2 arterial: 41.9 mmHg (ref 35.0–45.0)
pH, Arterial: 7.381 (ref 7.350–7.450)

## 2013-01-22 LAB — TYPE AND SCREEN: ABO/RH(D): A POS

## 2013-01-22 LAB — BASIC METABOLIC PANEL
BUN: 18 mg/dL (ref 6–23)
Calcium: 9.2 mg/dL (ref 8.4–10.5)
Creatinine, Ser: 1.02 mg/dL (ref 0.50–1.35)
GFR calc Af Amer: >90 mL/min (ref >90)
GFR calc non Af Amer: 80 mL/min — ABNORMAL LOW (ref >90)

## 2013-01-22 LAB — PROTEIN ELECTROPHORESIS, SERUM
Alpha-1-Globulin: 8.1 % — ABNORMAL HIGH (ref 2.9–4.9)
Alpha-2-Globulin: 13.6 % — ABNORMAL HIGH (ref 7.1–11.8)
Beta Globulin: 6.9 % (ref 4.7–7.2)
Gamma Globulin: 14.2 % (ref 11.1–18.8)
M-Spike, %: NOT DETECTED g/dL
Total Protein ELP: 6.4 g/dL (ref 6.0–8.3)

## 2013-01-22 LAB — CBC
HCT: 28.1 % — ABNORMAL LOW (ref 39.0–52.0)
MCH: 29.9 pg (ref 26.0–34.0)
MCHC: 35.2 g/dL (ref 30.0–36.0)
MCV: 84.9 fL (ref 78.0–100.0)
RDW: 13.9 % (ref 11.5–15.5)

## 2013-01-22 SURGERY — WOUND EXPLORATION
Anesthesia: General | Site: Spine Thoracic | Wound class: Clean

## 2013-01-22 MED ORDER — MENTHOL 3 MG MT LOZG: 1.0000 | LOZENGE | OROMUCOSAL | Status: DC | PRN

## 2013-01-22 MED ORDER — BACITRACIN 50000 UNITS IM SOLR
INTRAMUSCULAR | Status: AC
  Filled 2013-01-22: qty 1

## 2013-01-22 MED ORDER — SODIUM CHLORIDE 0.9 % IR SOLN
Status: DC | PRN
  Administered 2013-01-22: 08:00:00

## 2013-01-22 MED ORDER — PROPOFOL 10 MG/ML IV BOLUS
INTRAVENOUS | Status: DC | PRN
  Administered 2013-01-22: 100 mg via INTRAVENOUS

## 2013-01-22 MED ORDER — FENTANYL CITRATE 0.05 MG/ML IJ SOLN
INTRAMUSCULAR | Status: AC
  Filled 2013-01-22: qty 2

## 2013-01-22 MED ORDER — 0.9 % SODIUM CHLORIDE (POUR BTL) OPTIME
TOPICAL | Status: DC | PRN
  Administered 2013-01-22: 1000 mL

## 2013-01-22 MED ORDER — DOCUSATE SODIUM 100 MG PO CAPS: 100.0000 mg | ORAL_CAPSULE | Freq: Two times a day (BID) | ORAL | Status: DC

## 2013-01-22 MED ORDER — HYDROMORPHONE HCL PF 1 MG/ML IJ SOLN: 0.2500 mg | INTRAMUSCULAR | Status: DC | PRN

## 2013-01-22 MED ORDER — ALUM & MAG HYDROXIDE-SIMETH 200-200-20 MG/5ML PO SUSP: 30.0000 mL | Freq: Four times a day (QID) | ORAL | Status: DC | PRN

## 2013-01-22 MED ORDER — LIDOCAINE HCL (CARDIAC) 20 MG/ML IV SOLN
INTRAVENOUS | Status: DC | PRN
  Administered 2013-01-22: 50 mg via INTRAVENOUS

## 2013-01-22 MED ORDER — MIDAZOLAM HCL 2 MG/2ML IJ SOLN
INTRAMUSCULAR | Status: AC
  Filled 2013-01-22: qty 2

## 2013-01-22 MED ORDER — SUCCINYLCHOLINE CHLORIDE 20 MG/ML IJ SOLN
INTRAMUSCULAR | Status: DC | PRN
  Administered 2013-01-22: 100 mg via INTRAVENOUS

## 2013-01-22 MED ORDER — THROMBIN 20000 UNITS EX SOLR
CUTANEOUS | Status: DC | PRN
  Administered 2013-01-22: 08:00:00 via TOPICAL

## 2013-01-22 MED ORDER — MORPHINE SULFATE 2 MG/ML IJ SOLN: 1.0000 mg | INTRAMUSCULAR | Status: DC | PRN

## 2013-01-22 MED ORDER — GLYCOPYRROLATE 0.2 MG/ML IJ SOLN
INTRAMUSCULAR | Status: DC | PRN
  Administered 2013-01-22: 0.4 mg via INTRAVENOUS

## 2013-01-22 MED ORDER — ONDANSETRON HCL 4 MG/2ML IJ SOLN
INTRAMUSCULAR | Status: DC | PRN
  Administered 2013-01-22: 4 mg via INTRAVENOUS

## 2013-01-22 MED ORDER — DIAZEPAM 5 MG PO TABS: 5.0000 mg | ORAL_TABLET | Freq: Four times a day (QID) | ORAL | Status: DC | PRN

## 2013-01-22 MED ORDER — EPHEDRINE SULFATE 50 MG/ML IJ SOLN
INTRAMUSCULAR | Status: DC | PRN
  Administered 2013-01-22: 10 mg via INTRAVENOUS

## 2013-01-22 MED ORDER — SODIUM CHLORIDE 0.9 % IV SOLN
INTRAVENOUS | Status: DC | PRN
  Administered 2013-01-22: 07:00:00 via INTRAVENOUS

## 2013-01-22 MED ORDER — FENTANYL CITRATE 0.05 MG/ML IJ SOLN
INTRAMUSCULAR | Status: DC | PRN
  Administered 2013-01-22 (×2): 100 ug via INTRAVENOUS

## 2013-01-22 MED ORDER — SODIUM CHLORIDE 0.9 % IV SOLN
INTRAVENOUS | Status: DC
  Administered 2013-01-23: 04:00:00 via INTRAVENOUS

## 2013-01-22 MED ORDER — FENTANYL CITRATE 0.05 MG/ML IJ SOLN
50.0000 ug | Freq: Once | INTRAMUSCULAR | Status: AC
  Administered 2013-01-22: 50 ug via INTRAVENOUS

## 2013-01-22 MED ORDER — MIDAZOLAM HCL 2 MG/2ML IJ SOLN
2.0000 mg | Freq: Once | INTRAMUSCULAR | Status: AC
  Administered 2013-01-22: 1 mg via INTRAVENOUS

## 2013-01-22 MED ORDER — HYDROCODONE-ACETAMINOPHEN 5-325 MG PO TABS: 1.0000 | ORAL_TABLET | ORAL | Status: DC | PRN

## 2013-01-22 MED ORDER — PHENOL 1.4 % MT LIQD: 1.0000 | OROMUCOSAL | Status: DC | PRN

## 2013-01-22 MED ORDER — LACTATED RINGERS IV SOLN
INTRAVENOUS | Status: DC
  Administered 2013-01-22: 12:00:00 via INTRAVENOUS

## 2013-01-22 MED ORDER — ACETAMINOPHEN 650 MG RE SUPP: 650.0000 mg | RECTAL | Status: DC | PRN

## 2013-01-22 MED ORDER — CEFAZOLIN SODIUM-DEXTROSE 2-3 GM-% IV SOLR: 2.0000 g | Freq: Three times a day (TID) | INTRAVENOUS | Status: DC

## 2013-01-22 MED ORDER — NEOSTIGMINE METHYLSULFATE 1 MG/ML IJ SOLN
INTRAMUSCULAR | Status: DC | PRN
  Administered 2013-01-22: 3 mg via INTRAVENOUS

## 2013-01-22 MED ORDER — PHENYLEPHRINE HCL 10 MG/ML IJ SOLN
10.0000 mg | INTRAVENOUS | Status: DC | PRN
  Administered 2013-01-22: 50 ug/min via INTRAVENOUS

## 2013-01-22 MED ORDER — SODIUM CHLORIDE 0.9 % IV SOLN
INTRAVENOUS | Status: AC
  Filled 2013-01-22: qty 500

## 2013-01-22 MED ORDER — OXYCODONE-ACETAMINOPHEN 5-325 MG PO TABS: 1.0000 | ORAL_TABLET | ORAL | Status: DC | PRN

## 2013-01-22 MED ORDER — CEFAZOLIN SODIUM-DEXTROSE 2-3 GM-% IV SOLR
INTRAVENOUS | Status: AC
  Administered 2013-01-22: 2 g via INTRAVENOUS
  Filled 2013-01-22: qty 50

## 2013-01-22 MED ORDER — ONDANSETRON HCL 4 MG/2ML IJ SOLN: 4.0000 mg | INTRAMUSCULAR | Status: DC | PRN

## 2013-01-22 MED ORDER — ACETAMINOPHEN 325 MG PO TABS: 650.0000 mg | ORAL_TABLET | ORAL | Status: DC | PRN

## 2013-01-22 SURGICAL SUPPLY — 49 items
BAG DECANTER FOR FLEXI CONT (MISCELLANEOUS) ×2 IMPLANT
BENZOIN TINCTURE PRP APPL 2/3 (GAUZE/BANDAGES/DRESSINGS) ×2 IMPLANT
CANISTER SUCTION 2500CC (MISCELLANEOUS) ×2 IMPLANT
CLOTH BEACON ORANGE TIMEOUT ST (SAFETY) ×2 IMPLANT
DRAIN JACKSON PRATT 10MM FLAT (MISCELLANEOUS) ×2 IMPLANT
DRAPE LAPAROTOMY 100X72 PEDS (DRAPES) IMPLANT
DRAPE LAPAROTOMY 100X72X124 (DRAPES) ×2 IMPLANT
DRAPE POUCH INSTRU U-SHP 10X18 (DRAPES) IMPLANT
DRESSING TELFA 8X3 (GAUZE/BANDAGES/DRESSINGS) IMPLANT
DRSG OPSITE 4X5.5 SM (GAUZE/BANDAGES/DRESSINGS) IMPLANT
DURAPREP 26ML APPLICATOR (WOUND CARE) IMPLANT
DURAPREP 6ML APPLICATOR 50/CS (WOUND CARE) IMPLANT
ELECT REM PT RETURN 9FT ADLT (ELECTROSURGICAL) ×2
ELECTRODE REM PT RTRN 9FT ADLT (ELECTROSURGICAL) ×1 IMPLANT
EVACUATOR 1/8 PVC DRAIN (DRAIN) IMPLANT
GAUZE SPONGE 4X4 16PLY XRAY LF (GAUZE/BANDAGES/DRESSINGS) IMPLANT
GLOVE BIO SURGEON STRL SZ8.5 (GLOVE) ×2 IMPLANT
GLOVE BIOGEL M 8.0 STRL (GLOVE) IMPLANT
GLOVE BIOGEL PI IND STRL 7.0 (GLOVE) ×2 IMPLANT
GLOVE BIOGEL PI INDICATOR 7.0 (GLOVE) ×2
GLOVE SS BIOGEL STRL SZ 8 (GLOVE) ×1 IMPLANT
GLOVE SUPERSENSE BIOGEL SZ 8 (GLOVE) ×1
GLOVE SURG SS PI 7.0 STRL IVOR (GLOVE) ×2 IMPLANT
GOWN BRE IMP SLV AUR LG STRL (GOWN DISPOSABLE) IMPLANT
GOWN BRE IMP SLV AUR XL STRL (GOWN DISPOSABLE) ×4 IMPLANT
GOWN STRL REIN 2XL LVL4 (GOWN DISPOSABLE) IMPLANT
KIT BASIN OR (CUSTOM PROCEDURE TRAY) ×2 IMPLANT
KIT ROOM TURNOVER OR (KITS) ×2 IMPLANT
NEEDLE HYPO 18GX1.5 BLUNT FILL (NEEDLE) IMPLANT
NEEDLE HYPO 25X1 1.5 SAFETY (NEEDLE) ×2 IMPLANT
NEEDLE SPNL 20GX3.5 QUINCKE YW (NEEDLE) IMPLANT
NS IRRIG 1000ML POUR BTL (IV SOLUTION) ×2 IMPLANT
PACK LAMINECTOMY NEURO (CUSTOM PROCEDURE TRAY) ×2 IMPLANT
PAD ARMBOARD 7.5X6 YLW CONV (MISCELLANEOUS) ×10 IMPLANT
SENSORCAINE 0.5% W/EPI 1:200,000 IMPLANT
SPONGE GAUZE 4X4 12PLY (GAUZE/BANDAGES/DRESSINGS) ×2 IMPLANT
STRIP CLOSURE SKIN 1/2X4 (GAUZE/BANDAGES/DRESSINGS) ×2 IMPLANT
SUT VIC AB 0 CT1 18XCR BRD8 (SUTURE) ×2 IMPLANT
SUT VIC AB 0 CT1 8-18 (SUTURE) ×2
SUT VIC AB 2-0 CP2 18 (SUTURE) ×4 IMPLANT
SUT VIC AB 3-0 SH 8-18 (SUTURE) ×2 IMPLANT
SWAB CULTURE LIQ STUART DBL (MISCELLANEOUS) IMPLANT
SYR 20ML ECCENTRIC (SYRINGE) IMPLANT
SYR 3ML LL SCALE MARK (SYRINGE) IMPLANT
TAPE CLOTH SURG 4X10 WHT LF (GAUZE/BANDAGES/DRESSINGS) ×2 IMPLANT
TOWEL OR 17X24 6PK STRL BLUE (TOWEL DISPOSABLE) ×2 IMPLANT
TOWEL OR 17X26 10 PK STRL BLUE (TOWEL DISPOSABLE) ×2 IMPLANT
TUBE ANAEROBIC SPECIMEN COL (MISCELLANEOUS) IMPLANT
WATER STERILE IRR 1000ML POUR (IV SOLUTION) ×2 IMPLANT

## 2013-01-22 NOTE — Op Note (Signed)
Brief history: The patient is a 57 year old white male with metastatic cancer. He underwent a thoracic laminectomy last night secondary to thoracic epidural metastasis. After surgery the patient's strength was normal. The patient was noted to be quite weak this morning. Dr. Yetta Barre, who was on call, evaluated the patient and recommended emergent evacuation of a presumed epidural hematoma. The patient was brought to the operating room emergently. By the time I saw the patient he was intubated. I discussed situation with the patient's wife on the telephone and recommended a evacuation of presumed epidural hematoma. I explained the procedure, the risks, benefits, and alternatives and the likelihood of achieving our goals with surgery. I answered all her questions. She has consented on behalf of the patient.  Preop diagnosis: Thoracic epidural hematoma, paraparesis  Postop diagnosis: The same  Procedure: Evacuation of thoracic epidural hematoma  Surgeon: Dr. Delma Officer  Asst.: None  Anesthesia Gen. endotracheal  Specimens: None  Drains: One epidural Jackson-Pratt drain  Complications: None  Description of procedure: The patient was brought to the operating room by the anesthesia team. General endotracheal anesthesia was induced. I applied the Mayfield 3 point headrest the patient's calvarium. The patient was turned to the prone position on the chest rolls. I removed the patient's Hemovac drain and Steri-Strips. The patient's upper thoracic and cervical region was prepared with Betadine scrub and Betadine solution. Sterile drapes were applied. I then used a scalpel to incise the patient's fresh surgical scar. We used the cerebellar retractor for exposure. We encountered a subcutaneous hematoma which we removed with suction and irrigation. I then used a scalpel to incise the sutures in the fascia. We encountered an epidural hematoma. We remove it with suction and irrigation decompressing the spinal  cord. We encountered several minor oozing points which we coagulated with bipolar electrocautery. We did not encounter any significant bleeding points. I irrigated the wound out with bacitracin solution. I then removed the retractors. There was no significant bleeding at this point either. I placed a 10 mm flat Jackson-Pratt drain in the epidural space and tunneled it out through separate stab wound. We then reapproximated patient's thoracolumbar fascia with interrupted 0 Vicryl suture. We reapproximated the patient's subcutaneous tissue with interrupted 2-0 Vicryl suture. We reapproximated the skin with Steri-Strips and benzoin. The wound was then coated with bacitracin ointment. A sterile dressing was applied. The drapes were removed. The patient was subsequently returned to the supine position. I then removed the Mayfield 3 point headrest. This completed the procedure. By report all sponge instrument and needle counts were correct at the end this case.

## 2013-01-22 NOTE — Progress Notes (Signed)
prbcs infusing at 100cc/hr. No signs or symptoms of reaction

## 2013-01-22 NOTE — Anesthesia Preprocedure Evaluation (Addendum)
Anesthesia Evaluation  Patient identified by MRN, date of birth, ID band Patient awake    Reviewed: Allergy & Precautions, H&P , NPO status , Patient's Chart, lab work & pertinent test results  Airway Mallampati: III TM Distance: >3 FB Neck ROM: Limited    Dental no notable dental hx. (+) Teeth Intact and Dental Advisory Given   Pulmonary sleep apnea , COPD H/o SCC of the neck s/p radical neck dissection with radiation. Now mets to the spine. breath sounds clear to auscultation  Pulmonary exam normal       Cardiovascular hypertension, On Medications negative cardio ROS  Rhythm:Regular Rate:Normal     Neuro/Psych Chronic hyponatremia likely secondary to Etoh abuse. Possible SIADH.  Neuromuscular disease negative psych ROS   GI/Hepatic Neg liver ROS, GERD-  Controlled and Medicated,  Endo/Other  negative endocrine ROSHypothyroidism   Renal/GU negative Renal ROS  negative genitourinary   Musculoskeletal   Abdominal   Peds  Hematology  (+) Blood dyscrasia, anemia ,   Anesthesia Other Findings   Reproductive/Obstetrics negative OB ROS                         Anesthesia Physical Anesthesia Plan  ASA: IV and emergent  Anesthesia Plan: General   Post-op Pain Management:    Induction: Intravenous  Airway Management Planned: Oral ETT and Video Laryngoscope Planned  Additional Equipment:   Intra-op Plan:   Post-operative Plan: Extubation in OR and Possible Post-op intubation/ventilation  Informed Consent: I have reviewed the patients History and Physical, chart, labs and discussed the procedure including the risks, benefits and alternatives for the proposed anesthesia with the patient or authorized representative who has indicated his/her understanding and acceptance.   Dental advisory given  Plan Discussed with: CRNA and Surgeon  Anesthesia Plan Comments:         Anesthesia Quick  Evaluation

## 2013-01-22 NOTE — Progress Notes (Signed)
UR completed 

## 2013-01-22 NOTE — Anesthesia Procedure Notes (Signed)
Procedure Name: Intubation Date/Time: 01/22/2013 6:58 AM Performed by: Ferol Luz L Pre-anesthesia Checklist: Patient identified, Emergency Drugs available, Suction available, Patient being monitored and Timeout performed Patient Re-evaluated:Patient Re-evaluated prior to inductionOxygen Delivery Method: Circle system utilized Preoxygenation: Pre-oxygenation with 100% oxygen Intubation Type: IV induction, Cricoid Pressure applied and Rapid sequence Ventilation: Mask ventilation with difficulty Grade View: Grade II Tube type: Oral Tube size: 8.0 mm Number of attempts: 1 Airway Equipment and Method: Video-laryngoscopy and Stylet Placement Confirmation: ETT inserted through vocal cords under direct vision,  positive ETCO2 and breath sounds checked- equal and bilateral Secured at: 22 cm Tube secured with: Tape Dental Injury: Teeth and Oropharynx as per pre-operative assessment  Difficulty Due To: Difficulty was anticipated, Difficult Airway- due to reduced neck mobility and Difficult Airway- due to limited oral opening Future Recommendations: Recommend- induction with short-acting agent, and alternative techniques readily available Comments: Pt with previous radical neck and cervical fusion , immobile neck.   Good visualization with glide scope but challenging to guide ett through cords.

## 2013-01-22 NOTE — Evaluation (Signed)
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 He has known lung metastases. The patient has developed progressive back pain with lower extremity numbness over the last week. 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 He is now s/p T1-T3 laminectomy for debulking of a thoracic epidural tumor on 01/21/13. He had to return to the OR today 01/22/13 for evacuation fo throacic epidural hematoma. Pt extubated 6/6.  Will continue to follow acutely to address below problem list.  Recommending CIR to progress rehab before returning home.    OT Assessment  Patient needs continued OT Services    Follow Up Recommendations  CIR    Barriers to Discharge      Equipment Recommendations   (TBD)    Recommendations for Other Services    Frequency  Min 2X/week    Precautions / Restrictions Precautions Precautions: Fall;Back Precaution Comments: Educated pt on back precautions.   Pertinent Vitals/Pain See vitals    ADL  Grooming: Performed;Brushing hair;Maximal assistance Where Assessed - Grooming: Supported sitting Upper Body Bathing: Simulated;Maximal assistance Where Assessed - Upper Body Bathing: Supported sitting Lower Body Bathing: Simulated;Maximal assistance Where Assessed - Lower Body Bathing: Supported sitting Upper Body Dressing: Simulated;+1 Total assistance Where Assessed - Upper Body Dressing: Supported sitting Lower Body Dressing: Performed;+1 Total assistance Where Assessed - Lower Body Dressing: Supported sitting ADL Comments: Pt sat EOB 15 minutes.  Pt very painful and tends to  elevate right shoulder due to pain.  Performed breathing/relaxation techniques while sitting EOB, cueing pt to relax shoulders.  Pt limited by back pain and RLE weakness.    OT Diagnosis: Generalized weakness;Acute pain  OT Problem List: Decreased strength;Decreased activity tolerance;Impaired balance (sitting and/or standing);Decreased knowledge of use of DME or AE;Decreased knowledge of precautions;Pain;Impaired UE functional use OT Treatment Interventions: Self-care/ADL training;DME and/or AE instruction;Therapeutic activities;Patient/family education;Balance training;Cognitive remediation/compensation   OT Goals Acute Rehab OT Goals OT Goal Formulation: With patient Time For Goal Achievement: 02/05/13 Potential to Achieve Goals: Good ADL Goals Pt Will Perform Grooming: with set-up;Unsupported;Sitting, chair;Sitting, edge of bed ADL Goal: Grooming - Progress: Goal set today Pt Will Perform Upper Body Bathing: with set-up;Sitting, chair;Sitting, edge of bed;Unsupported ADL Goal: Upper Body Bathing - Progress: Goal set today Pt Will Perform Upper Body Dressing: with set-up;Sitting, chair;Sitting, bed;Unsupported ADL Goal: Upper Body Dressing - Progress: Goal set today Pt Will Transfer to Toilet: with max assist;Stand pivot transfer;3-in-1 ADL Goal: Toilet Transfer - Progress: Goal set today Miscellaneous OT Goals Miscellaneous OT Goal #1: Pt will perform bed mobility at min assist level as precursor for EOB ADLs. OT Goal: Miscellaneous Goal #1 - Progress: Goal set today Miscellaneous OT Goal #2: Pt will perform static sitting balance task with supervision >10 min as precursor for EOB ADLs. OT Goal: Miscellaneous Goal #2 - Progress: Goal set today  Visit Information  Last OT Received On: 01/22/13 Assistance Needed: +2 PT/OT Co-Evaluation/Treatment: Yes    Subjective Data      Prior Functioning     Home Living Lives With: Spouse Available Help at Discharge: Family;Available 24  hours/day Type of Home: House Home Access: Stairs to enter [employer] of Steps: 2 (  one step, walk some, and then another step) Entrance Stairs-Rails: None Home Layout: One level Bathroom Shower/Tub: Walk-in Contractor: Standard Home Adaptive Equipment: Shower chair with back;Hand-held shower hose;Walker - rolling;Straight cane;Bedside commode/3-in-1 Prior Function Level of Independence: Independent (per pt independent) Driving: No Vocation: Retired Comments: Per chart, "The patient reports that his lower extremity weakness and numbness started 2 days ago. He was having difficulty walking and his wife gave him a cane to use. Weakness has progressively worsened and he is now requiring assistance from wife with walking to the bathroom and dressing himself. He fell at home yesterday without any injury." Communication Communication: No difficulties Dominant Hand: Right         Vision/Perception     Cognition  Cognition Arousal/Alertness: Awake/alert Behavior During Therapy: WFL for tasks assessed/performed Overall Cognitive Status: Impaired/Different from baseline Area of Impairment: Problem solving Problem Solving: Slow processing General Comments: symptoms may be due to medicine/anesthesia from surgery earlier today.      Extremity/Trunk Assessment Right Upper Extremity Assessment RUE ROM/Strength/Tone: Deficits;Unable to fully assess (due to A line) RUE ROM/Strength/Tone Deficits: generally weak, 3+/5 Left Upper Extremity Assessment LUE ROM/Strength/Tone: Deficits LUE ROM/Strength/Tone Deficits: generally weak, 3+/5 Right Lower Extremity Assessment RLE ROM/Strength/Tone: Deficits RLE ROM/Strength/Tone Deficits: 0/4 dorsiflexion/plantarflexion, 3-/5 knee extension, 2+/5 knee flexion, 2+/5 hip flexion RLE Sensation: Deficits RLE Sensation Deficits: L4/5 dermatome in his right foot (cannot feel his big toe).   Left Lower Extremity  Assessment LLE ROM/Strength/Tone: Deficits LLE ROM/Strength/Tone Deficits: At least 3/5 per bed and seated MMT.   LLE Sensation: WFL - Light Touch     Mobility Bed Mobility Bed Mobility: Rolling Right;Right Sidelying to Sit;Sitting - Scoot to [employer] of Bed;Sit to Sidelying Right;Scooting to [facility] Rolling Right: 2: Max assist Right Sidelying to Sit: 1: +2 Total assist;With rails;HOB flat Right Sidelying to Sit: Patient Percentage: 40% Sitting - Scoot to Edge of Bed: 1: +2 Total assist Sitting - Scoot to Edge of Bed: Patient Percentage: 60% Sit to Sidelying Right: 1: +2 Total assist;With rail;HOB flat Sit to Sidelying Right: Patient Percentage: 40% Scooting to HOB: 1: +2 Total assist Scooting to [facility]: Patient Percentage: 0% Details for Bed Mobility Assistance: Assist for log roll technique- bending knees and supporting LEs off bed while elevating trunk. Assist to weight shift while scooting hips forward.     Exercise     Balance Balance Balance Assessed: Yes Static Sitting Balance Static Sitting - Balance Support: Bilateral upper extremity supported;Feet supported Static Sitting - Level of Assistance: 4: Min assist;3: Mod assist Static Sitting - Comment/# of Minutes: 15 minutes sitting EOB. Support posteriorly as pt fatigues, requiring more support.    End of Session OT - End of Session Activity Tolerance: Patient limited by pain;Patient limited by fatigue Patient left: in bed;with call bell/phone within reach;with bed alarm set Nurse Communication: Mobility status  GO   01/22/2013 Cipriano Mile OTR/L Pager [phone] Office [phone]   Cipriano Mile 01/22/2013, 3:38 PM

## 2013-01-22 NOTE — Progress Notes (Signed)
Pt's wife Dru Primeau was updated about pt's current condition and plan to take him back to OR. There were no questions asked at this time.

## 2013-01-22 NOTE — Progress Notes (Signed)
Pt is unable to void. Bladder scan showed 334 ml of urine. In and out cath performed by NA and 500 ml of urine was drained.

## 2013-01-22 NOTE — Progress Notes (Signed)
Pt. was assessed for MRI. Pt states that he has left femur metal plate, right arm and hand metal plates, inserted port-a-cath.

## 2013-01-22 NOTE — Progress Notes (Signed)
[facility] INPATIENT PROGRESS NOTE  Name: [name] [name]      MRN: [id number]    Location: 3108/3108-01  Date: 01/22/2013 Time:5:13 PM   Subjective: Interval History:[name] [name] still has lower extremity paresthesia; and now also weakness right more than left.  He denied SOB, chest pain, abdominal pain, visible bleeding, seizure, confusion.   Objective: Vital signs in last 24 hours: Temp:  [97 F (36.1 C)-97.8 F (36.6 C)] 97.7 F (36.5 C) (06/06 1542) Pulse Rate:  [50-80] 67 (06/06 1700) Resp:  [9-20] 12 (06/06 1700) BP: (91-156)/(53-90) 118/66 mmHg (06/06 1700) SpO2:  [10 %-100 %] 100 % (06/06 1700) Arterial Line BP: (110-199)/(55-89) 155/74 mmHg (06/06 1700) FiO2 (%):  [30 %-100 %] 30 % (06/06 1216)     PHYSICAL EXAM: Gen: Well-nourished man, in no acute distress. Eyes: No scleral icterus or jaundice. ENT: There was no oropharyngeal lesions. Neck was supple without thyromegaly. Lymphatics: Negative for cervical, supraclavicular, axillary, or inguinal adenopathy.  Respiratory: Lungs were clear bilaterally without wheezing or crackles. Cardiovascular: normal heart rate and rhythm; S1/S2; without murmur, rubs, or gallop. There was no pedal edema. GI: Abdomen was soft, flat, nontender, nondistended, without organomegaly. Skin exam was without ecchymosis, petechiae. Patient was alert and oriented. Attention was good. Language was appropriate. Mood was normal without depression. Speech was not pressured. Thought content was not tangential.        Studies/Results: Results for orders placed during the hospital encounter of 01/20/13 (from the past 48 hour(s))  OSMOLALITY, URINE     Status: Abnormal   Collection Time    01/20/13  6:53 PM      Result Value Range   Osmolality, Ur 137 (*) 390 - 1090 mOsm/kg  SODIUM, URINE, RANDOM     Status: None   Collection Time    01/20/13  6:53 PM      Result Value Range   Sodium, Ur 32    BASIC METABOLIC PANEL     Status:  Abnormal   Collection Time    01/21/13  4:20 AM      Result Value Range   Sodium 122 (*) 135 - 145 mEq/L   Potassium 4.6  3.5 - 5.1 mEq/L   Chloride 84 (*) 96 - 112 mEq/L   CO2 29  19 - 32 mEq/L   Glucose, Bld 131 (*) 70 - 99 mg/dL   BUN 9  6 - 23 mg/dL   Creatinine, Ser 0.86  0.50 - 1.35 mg/dL   Calcium 9.6  8.4 - 57.8 mg/dL   GFR calc non Af Amer >90  >90 mL/min   GFR calc Af Amer >90  >90 mL/min   Comment:            The eGFR has been calculated     using the CKD EPI equation.     This calculation has not been     validated in all clinical     situations.     eGFR's persistently     <90 mL/min signify     possible Chronic Kidney Disease.  CBC     Status: Abnormal   Collection Time    01/21/13  4:20 AM      Result Value Range   WBC 5.1  4.0 - 10.5 K/uL   RBC 3.08 (*) 4.22 - 5.81 MIL/uL   Hemoglobin 8.9 (*) 13.0 - 17.0 g/dL   HCT 46.9 (*) 62.9 - 52.8 %   MCV 85.7  78.0 - 100.0 fL  MCH 28.9  26.0 - 34.0 pg   MCHC 33.7  30.0 - 36.0 g/dL   RDW 16.1  09.6 - 04.5 %   Platelets 276  150 - 400 K/uL  PREPARE RBC (CROSSMATCH)     Status: None   Collection Time    01/21/13  8:00 AM      Result Value Range   Order Confirmation ORDER PROCESSED BY BLOOD BANK    SURGICAL PCR SCREEN     Status: Abnormal   Collection Time    01/21/13  8:41 AM      Result Value Range   MRSA, PCR POSITIVE (*) NEGATIVE   Comment: RESULT CALLED TO, READ BACK BY AND VERIFIED WITH:     HEATHER TRIPP,RN [id number] @ 1047 BY J SCOTTON   Staphylococcus aureus POSITIVE (*) NEGATIVE   Comment:            The Xpert SA Assay (FDA     approved for NASAL specimens     in patients over 36 years of age),     is one component of     a comprehensive surveillance     program.  Test performance has     been validated by [employer] for patients greater     than or equal to 60 year old.     It is not intended     to diagnose infection nor to     guide or monitor treatment.     RESULT CALLED TO, READ BACK BY  AND VERIFIED WITH:     HEATHER TRIPP,RN [id number] @ 1047 BY J SCOTTON  PROTIME-INR     Status: None   Collection Time    01/21/13  8:43 AM      Result Value Range   Prothrombin Time 14.4  11.6 - 15.2 seconds   INR 1.14  0.00 - 1.49  TYPE AND SCREEN     Status: None   Collection Time    01/21/13  6:20 PM      Result Value Range   ABO/RH(D) A POS     Antibody Screen NEG     Sample Expiration 01/24/2013     Unit Number [id number]     Blood Component Type RED CELLS,LR     Unit division 00     Status of Unit ISSUED,FINAL     Transfusion Status OK TO TRANSFUSE     Crossmatch Result Compatible     Unit Number [id number]     Blood Component Type RED CELLS,LR     Unit division 00     Status of Unit ISSUED,FINAL     Transfusion Status OK TO TRANSFUSE     Crossmatch Result Compatible     Unit Number [id number]     Blood Component Type RED CELLS,LR     Unit division 00     Status of Unit ISSUED     Transfusion Status OK TO TRANSFUSE     Crossmatch Result Compatible     Unit Number [id number]     Blood Component Type RED CELLS,LR     Unit division 00     Status of Unit ALLOCATED     Transfusion Status OK TO TRANSFUSE     Crossmatch Result Compatible    ABO/RH     Status: None   Collection Time    01/21/13  6:20 PM      Result Value Range   ABO/RH(D) A POS    PREPARE  RBC (CROSSMATCH)     Status: None   Collection Time    01/21/13  6:32 PM      Result Value Range   Order Confirmation ORDER PROCESSED BY BLOOD BANK    CBC     Status: Abnormal   Collection Time    01/22/13  4:25 AM      Result Value Range   WBC 16.4 (*) 4.0 - 10.5 K/uL   RBC 3.31 (*) 4.22 - 5.81 MIL/uL   Hemoglobin 9.9 (*) 13.0 - 17.0 g/dL   HCT 43.3 (*) 29.5 - 18.8 %   MCV 84.9  78.0 - 100.0 fL   MCH 29.9  26.0 - 34.0 pg   MCHC 35.2  30.0 - 36.0 g/dL   RDW 41.6  60.6 - 30.1 %   Platelets 258  150 - 400 K/uL  BASIC METABOLIC PANEL     Status: Abnormal   Collection Time    01/22/13  4:25 AM       Result Value Range   Sodium 122 (*) 135 - 145 mEq/L   Potassium 4.8  3.5 - 5.1 mEq/L   Chloride 86 (*) 96 - 112 mEq/L   CO2 26  19 - 32 mEq/L   Glucose, Bld 146 (*) 70 - 99 mg/dL   BUN 18  6 - 23 mg/dL   Creatinine, Ser 6.01  0.50 - 1.35 mg/dL   Calcium 9.2  8.4 - 09.3 mg/dL   GFR calc non Af Amer 80 (*) >90 mL/min   GFR calc Af Amer >90  >90 mL/min   Comment:            The eGFR has been calculated     using the CKD EPI equation.     This calculation has not been     validated in all clinical     situations.     eGFR's persistently     <90 mL/min signify     possible Chronic Kidney Disease.  PREPARE RBC (CROSSMATCH)     Status: None   Collection Time    01/22/13  7:37 AM      Result Value Range   Order Confirmation ORDER PROCESSED BY BLOOD BANK    BLOOD GAS, ARTERIAL     Status: Abnormal   Collection Time    01/22/13 10:28 AM      Result Value Range   FIO2 0.40     Delivery systems VENTILATOR     Mode PRESSURE REGULATED VOLUME CONTROL     VT 600     Rate 14     Peep/cpap 5.0     pH, Arterial 7.381  7.350 - 7.450   pCO2 arterial 41.9  35.0 - 45.0 mmHg   pO2, Arterial 172.0 (*) 80.0 - 100.0 mmHg   Bicarbonate 24.5 (*) 20.0 - 24.0 mEq/L   TCO2 25.8  0 - 100 mmol/L   Acid-base deficit 0.1  0.0 - 2.0 mmol/L   O2 Saturation 99.9     Patient temperature 97.6     Collection site A-LINE     Drawn by [phone]     Sample type ARTERIAL DRAW     Mr Thoracic Spine W Wo Contrast  01/20/2013   *RADIOLOGY REPORT*  Clinical Data: Squamous cell carcinoma with metastatic disease. Back pain and bilateral leg numbness.  Evaluate for metastatic spread.  MRI THORACIC SPINE WITHOUT AND WITH CONTRAST  Technique:  Multiplanar and multiecho pulse sequences of the thoracic spine were obtained without  and with intravenous contrast.  Contrast: 17mL MULTIHANCE GADOBENATE DIMEGLUMINE 529 MG/ML IV SOLN  Comparison: CT 01/05/2013.  Findings: Counting was performed from the craniocervical junction. There is  metastatic infiltration of T1-T3 which is extensive.  No pathologic compression fracture. Loss of the subarachnoid space at these levels with abnormal enhancing soft tissue around the thecal sac and cord anteriorly and posteriorly compatible with extraosseous extension of tumor. Complete effacement of the subarachnoid space with transverse and AP compression of the thoracic cord.  Extension into both neural foramina is present at T2-T3 and into the right neural foramen at T3-T4 and T1-T2.  Tumor extends into the paravertebral fat.  Pulmonary metastatic nodule in the posterior right upper lobe is again noted.  No convincing evidence of drop metastases in the lower thoracic spine. There is diffuse infiltration of the posterior elements from T1-T3. The bony metastatic disease also involves T4 and T5 although to a lesser extent and T1-T3. Heterogeneous marrow signal is present throughout, likely associated with chronic disease.  Mid thoracic compression fractures are present which appear chronic, without marrow edema.  Minimal thoracic spine degenerative disease is present with a few shallow disc bulges, most pronounced at T9-T10. Left interpolar renal cyst incidentally noted.  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  This encircles the thecal sac and produces severe stenosis with compression of the thoracic cord and complete effacement of the subarachnoid space.  Right greater than left foraminal invasion by tumor. Bony metastatic disease is evident from T1-T5.  There may be another small focus in the dorsal aspect of T7.  Critical Value/emergent results were called by telephone at the time of interpretation on 01/20/2013 at 1828 hours to Dr. [name], who verbally acknowledged these results.   Original Report Authenticated By: [name], M.D.   Dg Thoracic Spine 1 View  01/21/2013   *RADIOLOGY REPORT*  Clinical Data: Thoracic laminectomy for  tumor resection  THORACIC SPINE - 1 VIEW  Comparison: MRI 01/20/2013  Findings: A single frontal image demonstrate hardware projecting over the inferior cervical/upper thoracic spine.  Left sided Port-A- Cath is noted.  Endotracheal tube appears appropriately located. Support hardware is coiled over the neck. A clamp is positioned over the level of the spinous process of C7.  IMPRESSION: Intraoperative imaging as above.   Original Report Authenticated By: Christiana Pellant, M.D.   Dg Chest Port 1 View  01/22/2013   *RADIOLOGY REPORT*  Clinical Data: Postop.  Endotracheal tube.  PORTABLE CHEST - 1 VIEW  Comparison: 01/21/2013 intraoperative exam.  Findings: Buckling of the trachea.  The endotracheal tube is against the left lateral aspect of the buccal trachea with the tip 3 cm above the carina.  Right Mediport catheter tip distal superior vena cava.  Mediastinal drain overlies the lower cervical spine.  Surgical clips left supraclavicular region.  No gross pneumothorax.  Increased retrocardiac density may represent atelectasis. Infiltrate, effusion or mass not excluded.  This can be assessed on follow-up.  Heart slightly enlarged.  Central pulmonary vascular prominence.  IMPRESSION: Buckling of the trachea.  The endotracheal tube is against the left lateral aspect of the buccal trachea with the tip 3 cm above the carina.  Right Mediport catheter tip distal superior vena cava.  Mediastinal drain overlies the lower cervical spine.  Surgical clips left supraclavicular region.  No gross pneumothorax.  Increased retrocardiac density may represent atelectasis. Infiltrate, effusion or mass not excluded.  This can be assessed on follow-up.  Heart slightly enlarged.  Central pulmonary vascular prominence.   Original Report Authenticated By: Lacy Duverney, M.D.     MEDICATIONS: reviewed.     PROBLEM LIST:  1.  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  2.  Acute cord  compression. 3.  Anemia 4.  Hyponatremia.  5.  Hypertension. 6.  EtOH abuse. 7.  Hypothyroidism. 8.  BPH 9.  Depression, anxiety.     IMPRESSION:  He underwent  thoracic laminectomy for debulking of the epidural tumor and decompression of the spinal cord yesterday 01/21/13 with thoracic epidural hematoma and underwent evacuation on 01/22/13.     Assessment/Plan:  - Continue Dexamethasone for cord compression. - Hyponatremia is not due to SIADH since urine osmolality and urine sodium were not elevated.  I discussed the case with Dr. Delton Coombes who agreed.  I will discontinue fluid restriction order.  His hyponatremia is most likely due to EtOH and/or fluid deficit. I will restart IVF normal saline at 100cc/hr continuously.  - Long term plan:  Transfer to SNF for rehab and radiation at [facility] to facilitate transportation.  He will also need to transfer his care to another medical oncologist at [facility] for consideration of systemic chemo once he is done with radiation.    FULL CODE.

## 2013-01-22 NOTE — Progress Notes (Signed)
Pt was reassessed at 4 o'clock. Pt is unable to move his right leg completely (midnight assessment showed strong right foot dorsiflexion and plantar flexion), left foot has significant decrease in strength and movement comparing with midnight assessment. Pt denies numbness or tingling at this time in his lower extremities. Upper extremities are without change. Dr. Yetta Barre is notified and order is received to do thoracic MRI without contrast. Continue to monitor the pt.

## 2013-01-22 NOTE — Progress Notes (Signed)
Patient ID: [name] [name], male   DOB: 1956-08-07, 57 y.o.   MRN: [id number] Subjective:  The patient is alert and nods appropriately. He is in no apparent distress. He remains intubated.  Objective: Vital signs in last 24 hours: Temp:  [97.1 F (36.2 C)-98.6 F (37 C)] 97.8 F (36.6 C) (06/06 0852) Pulse Rate:  [50-88] 69 (06/06 0916) Resp:  [9-20] 12 (06/06 0916) BP: (100-176)/(54-95) 129/64 mmHg (06/06 0916) SpO2:  [10 %-100 %] 100 % (06/06 0916) Arterial Line BP: (123-199)/(55-89) 168/75 mmHg (06/06 0515) FiO2 (%):  [40 %-100 %] 40 % (06/06 0853)  Intake/Output from previous day: 06/05 0701 - 06/06 0700 In: 2148.8 [P.O.:120; I.V.:1078.8; Blood:700; IV Piggyback:250] Out: 690 [Urine:500; Drains:40; Blood:150] Intake/Output this shift: Total I/O In: 500 [I.V.:500] Out: 150 [Blood:150]  Physical exam the patient is Glasgow Coma Scale 11 intubated. He has normal gastrocnemius quadricep strength bilaterally  Lab Results:  Recent Labs  01/21/13 0420 01/22/13 0425  WBC 5.1 16.4*  HGB 8.9* 9.9*  HCT 26.4* 28.1*  PLT 276 258   BMET  Recent Labs  01/21/13 0420 01/22/13 0425  NA 122* 122*  K 4.6 4.8  CL 84* 86*  CO2 29 26  GLUCOSE 131* 146*  BUN 9 18  CREATININE 0.89 1.02  CALCIUM 9.6 9.2    Studies/Results: Mr Thoracic Spine W Wo Contrast  01/20/2013   *RADIOLOGY REPORT*  Clinical Data: Squamous cell carcinoma with metastatic disease. Back pain and bilateral leg numbness.  Evaluate for metastatic spread.  MRI THORACIC SPINE WITHOUT AND WITH CONTRAST  Technique:  Multiplanar and multiecho pulse sequences of the thoracic spine were obtained without and with intravenous contrast.  Contrast: 17mL MULTIHANCE GADOBENATE DIMEGLUMINE 529 MG/ML IV SOLN  Comparison: CT 01/05/2013.  Findings: Counting was performed from the craniocervical junction. There is metastatic infiltration of T1-T3 which is extensive.  No pathologic compression fracture. Loss of the subarachnoid space  at these levels with abnormal enhancing soft tissue around the thecal sac and cord anteriorly and posteriorly compatible with extraosseous extension of tumor. Complete effacement of the subarachnoid space with transverse and AP compression of the thoracic cord.  Extension into both neural foramina is present at T2-T3 and into the right neural foramen at T3-T4 and T1-T2.  Tumor extends into the paravertebral fat.  Pulmonary metastatic nodule in the posterior right upper lobe is again noted.  No convincing evidence of drop metastases in the lower thoracic spine. There is diffuse infiltration of the posterior elements from T1-T3. The bony metastatic disease also involves T4 and T5 although to a lesser extent and T1-T3. Heterogeneous marrow signal is present throughout, likely associated with chronic disease.  Mid thoracic compression fractures are present which appear chronic, without marrow edema.  Minimal thoracic spine degenerative disease is present with a few shallow disc bulges, most pronounced at T9-T10. Left interpolar renal cyst incidentally noted.  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  This encircles the thecal sac and produces severe stenosis with compression of the thoracic cord and complete effacement of the subarachnoid space.  Right greater than left foraminal invasion by tumor. Bony metastatic disease is evident from T1-T5.  There may be another small focus in the dorsal aspect of T7.  Critical Value/emergent results were called by telephone at the time of interpretation on 01/20/2013 at 1828 hours to Dr. [name], who verbally acknowledged these results.   Original Report Authenticated By: [name], M.D.   Dg  Thoracic Spine 1 View  01/21/2013   *RADIOLOGY REPORT*  Clinical Data: Thoracic laminectomy for tumor resection  THORACIC SPINE - 1 VIEW  Comparison: MRI 01/20/2013  Findings: A single frontal image demonstrate  hardware projecting over the inferior cervical/upper thoracic spine.  Left sided Port-A- Cath is noted.  Endotracheal tube appears appropriately located. Support hardware is coiled over the neck. A clamp is positioned over the level of the spinous process of C7.  IMPRESSION: Intraoperative imaging as above.   Original Report Authenticated By: Christiana Pellant, M.D.    Assessment/Plan: Status post evacuation of epidural hematoma: The patient is much stronger in his lower extremities. He remains intubated at the recommendation of anesthesia. We will transfer to the ICU intubated and ask the pulmonologist to work on weaning and extubating the patient.  LOS: 2 days     Mikah Rottinghaus D 01/22/2013, 9:27 AM

## 2013-01-22 NOTE — Transfer of Care (Signed)
Immediate Anesthesia Transfer of Care Note  Patient: [name] [name]  Procedure(s) Performed: Procedure(s) with comments: WOUND EXPLORATION (N/A) - Thoracic one -thoracic three  Patient Location: PACU  Anesthesia Type:General  Level of Consciousness: awake, alert , oriented and patient cooperative  Airway & Oxygen Therapy: Patient Spontanous Breathing and Patient placed on Ventilator (see vital sign flow sheet for setting)  Post-op Assessment: Report given to PACU RN, Post -op Vital signs reviewed and unstable, Anesthesiologist notified and Patient moving all extremities  Post vital signs: Reviewed and stable  Complications: No apparent anesthesia complications

## 2013-01-22 NOTE — Progress Notes (Signed)
Rehab Admissions Coordinator Note:  Patient was screened by Clois Dupes for appropriateness for an Inpatient Acute Rehab Consult.  At this time, we are recommending Inpatient Rehab consult early next week as he progresses over the weekend.  Clois Dupes 01/22/2013, 3:34 PM  I can be reached at [phone].

## 2013-01-22 NOTE — Progress Notes (Signed)
I was called by Dr. Yetta Barre this morning who told me the patient became progressively more paraparetic. The patient was brought emergently to the OR for exploration of his wound and presumed evacuation of epidural hematoma. I discussed the events of the evening and the situation with the patient's wife via the telephone. I've recommended that he undergo expiration of his wound and presumed evacuation of epidural hematoma. I explained this procedure to her as well as the risks, benefits, and alternatives. I have answered all her questions. She has verbally consented to the surgery on behalf of the patient.

## 2013-01-22 NOTE — Progress Notes (Signed)
Pt. On vent from OR, 40 percent O2, rate14, TV 600.Marland KitchenPRBC infusing

## 2013-01-22 NOTE — Anesthesia Postprocedure Evaluation (Signed)
  Anesthesia Post-op Note  Patient: [name] [name]  Procedure(s) Performed: Procedure(s) with comments: WOUND EXPLORATION (N/A) - Thoracic one -thoracic three  Patient Location: PACU and ICU  Anesthesia Type:General  Level of Consciousness: sedated  Airway and Oxygen Therapy: Patient remains intubated per anesthesia plan  Post-op Pain: mild  Post-op Assessment: Post-op Vital signs reviewed  Post-op Vital Signs: Reviewed  Complications: No apparent anesthesia complications

## 2013-01-22 NOTE — Procedures (Signed)
Extubation Procedure Note  Patient Details:   Name: [name] [name] DOB: 1956/02/26 MRN: [id number]   Airway Documentation:     Evaluation  O2 sats: stable throughout Complications: No apparent complications Patient did tolerate procedure well. Bilateral Breath Sounds: Clear;Diminished   Yes pt able to vocalize.   Pt extubated at this time and placed on 4L [place] and tolerated well. Pt able to breathe around deflated cuff. No Complications noted. No stridor noted. VS stable. RT will continue to monitor.    Loyal Jacobson [facility] 01/22/2013, 12:50 PM

## 2013-01-22 NOTE — Evaluation (Signed)
Physical Therapy Evaluation Patient Details Name: [name] [name] MRN: [id number] DOB: 06/20/1956 Today's Date: 01/22/2013 Time: 1324-4010 PT Time Calculation (min): 49 min  PT Assessment / Plan / Recommendation Clinical Impression  The patient is a 57 year old white male who has a history of squamous cell carcinoma which has been treated out of town. He has known lung metastases. The patient has developed progressive back pain with lower extremity numbness over the last week. 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  He is now s/p T1-T3 laminectomy for debulking of a thoracic epidural tumor on 01/21/13.  He had to return to the OR today 01/22/13 for evacuation fo throacic epidural hematoma.  The pt is now extubated and agreeable to starting mobility.  He was able to tolerate sitting EOB, but has limited movement and decreased sensation in his right foot and leg.   We were not able to attempt standing today.  He would benefit from Inpatient rehab consult and is agreeable to consider this venue, but if he can he would like to go home.     PT Assessment  Patient needs continued PT services    Follow Up Recommendations  CIR    Does the patient have the potential to tolerate intense rehabilitation     yes  Barriers to Discharge None None    Equipment Recommendations  Wheelchair (measurements PT);Wheelchair cushion (measurements PT)    Recommendations for Other Services Rehab consult   Frequency Min 5X/week    Precautions / Restrictions Precautions Precautions: Fall;Back Precaution Comments: verbally reviewed back precautions, lifting restrictions, and log roll technique.     Pertinent Vitals/Pain See vitals flow sheet.       Mobility  Bed Mobility Bed Mobility: Rolling Right;Right Sidelying to Sit;Sitting - Scoot to [place] of Bed;Sit to Sidelying Right;Scooting to [facility] Rolling Right:  2: Max assist Right Sidelying to Sit: 1: +2 Total assist;With rails;HOB flat Right Sidelying to Sit: Patient Percentage: 40% Sitting - Scoot to Edge of Bed: 1: +2 Total assist Sitting - Scoot to Edge of Bed: Patient Percentage: 60% Sit to Sidelying Right: 1: +2 Total assist;With rail;HOB flat Sit to Sidelying Right: Patient Percentage: 40% Scooting to HOB: 1: +2 Total assist Scooting to [facility]: Patient Percentage: 0% Details for Bed Mobility Assistance: Log roll technique reviewed.  Facilitation needed to help pt bend bil knees and roll to his right side.  Assist needed at trunk and bil legs to get to sitting.  Pt needed assist to weight shift hips with bed pad to get to EOB. He was unable to fully push with right upper extremity due to A-line.          PT Diagnosis: Difficulty walking;Abnormality of gait;Generalized weakness;Acute pain;Altered mental status  PT Problem List: Decreased strength;Decreased activity tolerance;Decreased balance;Decreased mobility;Decreased range of motion;Decreased coordination;Decreased cognition;Decreased knowledge of use of DME;Decreased knowledge of precautions;Pain;Impaired sensation PT Treatment Interventions: DME instruction;Gait training;Stair training;Functional mobility training;Therapeutic activities;Therapeutic exercise;Balance training;Neuromuscular re-education;Cognitive remediation;Patient/family education;Wheelchair mobility training;Modalities   PT Goals Acute Rehab PT Goals PT Goal Formulation: With patient Time For Goal Achievement: 01/29/13 Potential to Achieve Goals: Good Pt will Roll Supine to Right Side: with min assist PT Goal: Rolling Supine to Right Side - Progress: Goal set today Pt will Roll Supine to Left Side: with min assist PT Goal: Rolling Supine to Left Side - Progress: Goal set today Pt will go Supine/Side to Sit: with min assist PT  Goal: Supine/Side to Sit - Progress: Goal set today Pt will go Sit to Supine/Side: with min  assist PT Goal: Sit to Supine/Side - Progress: Goal set today Pt will go Sit to Stand: with +2 total assist;from elevated surface (pt 70%) PT Goal: Sit to Stand - Progress: Goal set today Pt will go Stand to Sit: with +2 total assist;to elevated surface;with upper extremity assist (pt 70%) PT Goal: Stand to Sit - Progress: Goal set today Pt will Transfer Bed to Chair/Chair to Bed: with +2 total assist (pt 70%) PT Transfer Goal: Bed to Chair/Chair to Bed - Progress: Goal set today Pt will Ambulate: 51 - 150 feet;with least restrictive assistive device;with +2 total assist (pt 70%) PT Goal: Ambulate - Progress: Goal set today Pt will Go Up / Down Stairs: 1-2 stairs;with least restrictive assistive device;with +2 total assist (pt 70%) PT Goal: Up/Down Stairs - Progress: Goal set today Additional Goals Additional Goal #1: Pt will verbalize 3/3 back precautions PT Goal: Additional Goal #1 - Progress: Goal set today  Visit Information  Last PT Received On: 01/22/13 Assistance Needed: +2 PT/OT Co-Evaluation/Treatment: Yes    Subjective Data  Subjective: Pt reports that he did not need assistance prior to surgery, but per chart he needed help bathing, dressing and walking.   Patient Stated Goal: to go home with wife and have her take care of him.     Prior Functioning  Home Living Lives With: Spouse Available Help at Discharge: Family;Available 24 hours/day Type of Home: House Home Access: Stairs to enter [employer] of Steps: 2 (one step, walk some, and then another step) Entrance Stairs-Rails: None Home Layout: One level Bathroom Shower/Tub: Walk-in Contractor: Standard Home Adaptive Equipment: Shower chair with back;Hand-held shower hose;Walker - rolling;Straight cane;Bedside commode/3-in-1 Prior Function Level of Independence: Independent (per pt independent) Driving: No Vocation: Retired Comments: Per chart, "The patient reports that his lower  extremity weakness and numbness started 2 days ago. He was having difficulty walking and his wife gave him a cane to use. Weakness has progressively worsened and he is now requiring assistance from wife with walking to the bathroom and dressing himself. He fell at home yesterday without any injury." Communication Communication: No difficulties Dominant Hand: Right    Cognition  Cognition Arousal/Alertness: Awake/alert Behavior During Therapy: WFL for tasks assessed/performed Overall Cognitive Status: Impaired/Different from baseline Area of Impairment: Problem solving Problem Solving: Slow processing General Comments: symptoms may be due to medicine/anesthesia from surgery earlier today.      Extremity/Trunk Assessment Right Lower Extremity Assessment RLE ROM/Strength/Tone: Deficits RLE ROM/Strength/Tone Deficits: 0/4 dorsiflexion/plantarflexion, 3-/5 knee extension, 2+/5 knee flexion, 2+/5 hip flexion RLE Sensation: Deficits RLE Sensation Deficits: L4/5 dermatome in his right foot (cannot feel his big toe).   Left Lower Extremity Assessment LLE ROM/Strength/Tone: Deficits LLE ROM/Strength/Tone Deficits: At least 3/5 per bed and seated MMT.   LLE Sensation: WFL - Light Touch   Balance Balance Balance Assessed: Yes Static Sitting Balance Static Sitting - Balance Support: Bilateral upper extremity supported;Feet supported Static Sitting - Level of Assistance: 3: Mod assist;4: Min assist Static Sitting - Comment/# of Minutes: 15 mins EOB working on sitting tolerance and balance.  Pt needed support at trunk up to mod assist as he fatigued to support himself EOB. Pt with elevated right upper trap and scapula due to increased pain on this side of his neck and upper back.  With cues he was able to relax these muscles.    End  of Session PT - End of Session Equipment Utilized During Treatment: Oxygen (3 L O2 [place]) Activity Tolerance: Patient limited by fatigue;Patient limited by pain Patient  left: in bed;with call bell/phone within reach Nurse Communication: Mobility status    [name] [name], PT, DPT [phone]   01/22/2013, 3:25 PM

## 2013-01-22 NOTE — Addendum Note (Signed)
Addendum created 01/22/13 0910 by Tyrone Nine, CRNA   Modules edited: Anesthesia Responsible Staff

## 2013-01-22 NOTE — Consult Note (Signed)
PULMONARY  / CRITICAL CARE MEDICINE  Name: [name] [name] MRN: [id number] DOB: 13-Mar-1956    ADMISSION DATE:  01/20/2013 CONSULTATION DATE:  6/6  REFERRING MD :  Lovell Sheehan  PRIMARY SERVICE: Ha   CHIEF COMPLAINT:  Post-op resp failure   BRIEF PATIENT DESCRIPTION:   57 year old white male with metastatic cancer. He underwent a thoracic laminectomy on 6/5 secondary to thoracic epidural metastasis. After surgery the patient's strength was normal. The am of 6/6 he was noted to be quite weak. The patient was brought to the operating room emergently. In OR he underwent evacuation of thoracic epidural hematoma. He returned to the ICU on the vent. PCCM asked to eval for vent weaning.   SIGNIFICANT EVENTS / STUDIES:  6/5: T1-T3 laminectomy for debulking of a thoracic epidural tumor 6/6: Evacuation of thoracic epidural hematoma   LINES / TUBES: A line 6/5>>> OETT 6/6>>> hemovac 6/5>>> JP 6/5>>  CULTURES: MRSA PCR 6/5: positive   ANTIBIOTICS: Ancef 6/5 (surgical prophylaxis)  HISTORY OF PRESENT ILLNESS:   The patient is a 57 year old white male with metastatic cancer. He underwent a thoracic laminectomy on 6/5 secondary to thoracic epidural metastasis. After surgery the patient's strength was normal. The am of 6/6 he was noted to be quite weak. Dr. Yetta Barre, who was on call, evaluated the patient and recommended emergent evacuation of a presumed epidural hematoma. The patient was brought to the operating room emergently. In OR he underwent evacuation of thoracic epidural hematoma. He returned to the ICU on the vent. PCCM asked to eval for vent weaning.   PAST MEDICAL HISTORY :  Past Medical History  Diagnosis Date  . Squamous cell carcinoma of head and neck 2011    Unknown primary. He presented with right sided ear pain. Biopsy in the neck nodes showed squamous cell carcinoma. He underwent neck dissection then chemoradiation. He developed lung nodules in August 2012. He received carboplatin,  5-FU, cetuximab which in October 2012 until may 2013 with multiple dose delays and reductions because of toxicities. He had disease progression in the lungs in may 2013.  Marland Kitchen Hearing loss     wears aides  . Hypothyroid   . Hypertension   . Gastroesophageal reflux disease     well controlled on omeprazole  . Obstructive sleep apnea     not using CPAP  . Anemia   . Dysphagia   . BPH (benign prostatic hyperplasia)   . Restlessness and agitation     unclear etiology.  His previous physicians gave him  Risperdal.   . Urge incontinence   . Alcohol abuse   . History of smoking   . Emphysema 2013    mild centrilobular per CT  . Lung metastases 5/22014  . 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 Modified radical neck dissection  2011    left side.   . Thyroidectomy, partial  2011  . Lung biopsy    . Other surgical history  2008, 2009    L femur rod  . Other surgical history  2008    R radius  . Thoracic spine surgery  01/2013    debluking of tumor   Prior to Admission medications   Medication Sig Start Date End Date Taking? Authorizing Provider  aspirin 81 MG tablet Take 81 mg by mouth every morning.    Yes Historical Provider, MD  Cholecalciferol (VITAMIN D3) 5000 UNITS TABS Take 2 tablets by  mouth every evening.    Yes Historical Provider, MD  escitalopram (LEXAPRO) 20 MG tablet TAKE 1 TABLET BY MOUTH EVERY DAY 01/01/13  Yes Eustaquio Boyden, MD  levothyroxine (SYNTHROID, LEVOTHROID) 50 MCG tablet Take 50-100 mcg by mouth See admin instructions. 50 mcg daily with the exception of 2 days out the week patient takes 100 mcg (no specific day) 12/07/12  Yes Eustaquio Boyden, MD  losartan (COZAAR) 100 MG tablet  12/31/12  Yes Eustaquio Boyden, MD  magnesium gluconate (MAGONATE) 500 MG tablet Take 500 mg by mouth every evening.    Yes Historical Provider, MD  naproxen sodium (ANAPROX) 220 MG tablet Take 440 mg by  mouth every morning.   Yes Historical Provider, MD  NON FORMULARY Take 30 mLs by mouth every morning. Flor-essence 2 TBSP   Yes Historical Provider, MD  omeprazole (PRILOSEC) 40 MG capsule Take 40 mg by mouth every morning. 10/20/12  Yes Eustaquio Boyden, MD  ondansetron (ZOFRAN) 8 MG tablet Take 8 mg by mouth every 8 (eight) hours as needed for nausea.    Yes Historical Provider, MD  prochlorperazine (COMPAZINE) 10 MG tablet Take 10 mg by mouth every 6 (six) hours as needed (nausea).    Yes Historical Provider, MD  risperiDONE (RISPERDAL) 0.25 MG tablet Take 0.25 mg by mouth every evening.   Yes Historical Provider, MD  tamsulosin (FLOMAX) 0.4 MG CAPS Take 0.4 mg by mouth every evening. 01/18/13  Yes Eustaquio Boyden, MD  Albuterol Sulfate (PROAIR HFA IN) Inhale 1 puff into the lungs every 6 (six) hours as needed (FOR SHORTNESS OF BREATH).     Historical Provider, MD   Allergies  Allergen Reactions  . Benadryl (Diphenhydramine Hcl) Anxiety    IV only-oral okay    FAMILY HISTORY:  Family History  Problem Relation Age of Onset  . Cancer Father     Melanoma  . Cancer Maternal Grandfather     laryngeal  . Hypertension Father   . CAD Neg Hx   . Stroke Neg Hx   . Diabetes Neg Hx    SOCIAL HISTORY:  reports that he quit smoking about 11 years ago. He quit smokeless tobacco use about 11 years ago. He reports that  drinks alcohol. He reports that he does not use illicit drugs.  REVIEW OF SYSTEMS:   Unable. On vent   SUBJECTIVE:   VITAL SIGNS: Temp:  [97 F (36.1 C)-98.2 F (36.8 C)] 97.7 F (36.5 C) (06/06 1100) Pulse Rate:  [50-81] 61 (06/06 1100) Resp:  [9-20] 14 (06/06 1100) BP: (97-176)/(54-95) 97/58 mmHg (06/06 1100) SpO2:  [10 %-100 %] 100 % (06/06 1100) Arterial Line BP: (113-199)/(55-89) 113/59 mmHg (06/06 1100) FiO2 (%):  [40 %-100 %] 40 % (06/06 1100) HEMODYNAMICS:   VENTILATOR SETTINGS: Vent Mode:  [-] PRVC FiO2 (%):  [40 %-100 %] 40 % Set Rate:  [14 bmp] 14 bmp Vt  Set:  [600 mL] 600 mL PEEP:  [5 cmH20] 5 cmH20 INTAKE / OUTPUT: Intake/Output     06/05 0701 - 06/06 0700 06/06 0701 - 06/07 0700   P.O. 120    I.V. (mL/kg) 1078.8 (12.9) 500 (6)   Blood 700    IV Piggyback 250    Total Intake(mL/kg) 2148.8 (25.6) 500 (6)   Urine (mL/kg/hr) 500 (0.2)    Drains 40 (0) 25 (0.1)   Blood 150 (0.1) 150 (0.4)   Total Output 690 175   Net +1458.8 +325        Urine Occurrence 1  x      PHYSICAL EXAMINATION: General:  No acute distress. Looks comfortable on SBT Neuro:  Awake, alert, no focal def.  HEENT:  Left facial reconstruction  Cardiovascular:  rrr Lungs:  clear Abdomen: soft, non-tender  Musculoskeletal: equal strength  Skin:  Intact   LABS:  Recent Labs Lab 01/20/13 0806 01/21/13 0420 01/21/13 0843 01/22/13 0425 01/22/13 1028  HGB 7.5* 8.9*  --  9.9*  --   WBC 6.1 5.1  --  16.4*  --   PLT 246 276  --  258  --   NA 119* 122*  --  122*  --   K 4.9 4.6  --  4.8  --   CL 83* 84*  --  86*  --   CO2 29 29  --  26  --   GLUCOSE 93 131*  --  146*  --   BUN 8.5 9  --  18  --   CREATININE 0.9 0.89  --  1.02  --   CALCIUM 9.2 9.6  --  9.2  --   INR  --   --  1.14  --   --   PHART  --   --   --   --  7.381  PCO2ART  --   --   --   --  41.9  PO2ART  --   --   --   --  172.0*   No results found for this basename: GLUCAP,  in the last 168 hours  CXR: ETT good position, No infiltrate. Low vol   ASSESSMENT / PLAN:  PULMONARY A: Post-op resp failure  Gas exchange looks good. 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 serum osmo was 248  P:   Avoid volume excess Fluid restrict Low  threshold lasix Chem in am   GASTROINTESTINAL A:  No acute issue  P:   Advance diet as tol Will need dysphagia eval  Fluid restrict  HEMATOLOGIC A:   Squamous cell carcinoma of head and neck  Acute on chronic anemia: ABL superimposed on ACD in setting of epidural hematoma  P:  Trend CBC Transfuse for hemorrhage  No heparin   INFECTIOUS A:  Leukocytosis. Likely SIRS response vs steroids . No clear source of infection.  P:   Low threshold for abx   ENDOCRINE A:  Hyperglycemia        Hypothyroidism  P:   Trend and glucose control   NEUROLOGIC A:  Thoracic Epidural hematoma w/ resultant paraplegia, s/p evac on 6/6 P:   Post-op per n-surg   TODAY'S SUMMARY:  56 yom s/p evac of thoracic epidural hematoma. PCCM asked to assess for vent weaning / critical care.   I have personally obtained a history, examined the patient, evaluated laboratory and imaging results, formulated the assessment and plan and placed orders. CRITICAL CARE: The patient is critically ill with multiple organ systems failure and requires high complexity decision making for assessment and support, frequent evaluation and titration of therapies, application of advanced monitoring technologies and extensive interpretation of multiple databases. Critical Care Time devoted to  patient care services described in this note is 35 minutes.   Mcarthur Rossetti. Tyson Alias, MD, FACP Pgr: [phone]  Pulmonary & Critical Care  Pulmonary and Critical Care Medicine [facility] Pager: [phone]  01/22/2013, 11:53 AM

## 2013-01-22 NOTE — Progress Notes (Signed)
I was called to see the patient for weakness in his legs. He had surgery last night by Dr. Lovell Sheehan in the form of thoracic laminectomy for epidural tumor. He has bloody drainage from his incision. His Hemovac put out 40 cc. He cannot move his right foot has fairly good knee extensor strength but weak proximal hip flexor strength. He can move his left foot and has fairly good knee extensor strength but does not have antigravity hip flexor strength. I suspect he has an epidural hematoma. He is posted for emergency surgery for a wound exploration for suspected epidural hematoma. Lovell Sheehan is on his way to perform the surgery.

## 2013-01-22 NOTE — Addendum Note (Signed)
Addendum created 01/22/13 1610 by Tyrone Nine, CRNA   Modules edited: Anesthesia Responsible Staff

## 2013-01-23 ENCOUNTER — Inpatient Hospital Stay (HOSPITAL_COMMUNITY): Payer: Federal, State, Local not specified - PPO

## 2013-01-23 DIAGNOSIS — J9600 Acute respiratory failure, unspecified whether with hypoxia or hypercapnia: Secondary | ICD-10-CM

## 2013-01-23 DIAGNOSIS — C7952 Secondary malignant neoplasm of bone marrow: Secondary | ICD-10-CM

## 2013-01-23 DIAGNOSIS — C7951 Secondary malignant neoplasm of bone: Secondary | ICD-10-CM

## 2013-01-23 LAB — CBC
Hemoglobin: 9.9 g/dL — ABNORMAL LOW (ref 13.0–17.0)
MCH: 29.6 pg (ref 26.0–34.0)
Platelets: 238 10*3/uL (ref 150–400)
RBC: 3.35 MIL/uL — ABNORMAL LOW (ref 4.22–5.81)
WBC: 10.7 10*3/uL — ABNORMAL HIGH (ref 4.0–10.5)

## 2013-01-23 LAB — BASIC METABOLIC PANEL
CO2: 24 mEq/L (ref 19–32)
Calcium: 8.7 mg/dL (ref 8.4–10.5)
GFR calc non Af Amer: 75 mL/min — ABNORMAL LOW (ref >90)
Potassium: 4.8 mEq/L (ref 3.5–5.1)
Sodium: 123 mEq/L — ABNORMAL LOW (ref 135–145)

## 2013-01-23 NOTE — Progress Notes (Signed)
Physical Therapy Treatment Patient Details Name: [name] [name] MRN: [id number] DOB: Jan 08, 1956 Today's Date: 01/23/2013 Time: 3086-5784 PT Time Calculation (min): 33 min  PT Assessment / Plan / Recommendation Comments on Treatment Session  Pt very motivated & willing to participate.   Cont's to present with signficant LE weakness.  Cont to strongly recommend CIR at d/c.      Follow Up Recommendations  CIR     Does the patient have the potential to tolerate intense rehabilitation     Barriers to Discharge        Equipment Recommendations  Wheelchair (measurements PT);Wheelchair cushion (measurements PT)    Recommendations for Other Services    Frequency Min 5X/week   Plan Discharge plan remains appropriate;Frequency remains appropriate    Precautions / Restrictions Precautions Precautions: Fall;Back Precaution Comments: Reviewed back precautions and log roll technique.       Mobility  Bed Mobility Bed Mobility: Rolling Right;Right Sidelying to Sit;Sit to Sidelying Right Rolling Right: 2: Max assist;With rail Right Sidelying to Sit: 1: +2 Total assist;HOB flat Right Sidelying to Sit: Patient Percentage: 30% Sitting - Scoot to Edge of Bed: 1: +2 Total assist Sitting - Scoot to Edge of Bed: Patient Percentage: 50% Sit to Sidelying Right: 1: +2 Total assist;HOB flat Sit to Sidelying Right: Patient Percentage: 30% Scooting to HOB: 1: +2 Total assist Scooting to [facility]: Patient Percentage: 0% Details for Bed Mobility Assistance: Assist for log roll technique- bending knees and bringing LEs OOB, elevating trunk off bed. Transfers Transfers: Not assessed    Exercises General Exercises - Lower Extremity Ankle Circles/Pumps: AROM;Both;10 reps Heel Slides: AROM;AAROM;Both;5 reps Hip ABduction/ADduction: AAROM;Both;5 reps;Seated Hip Flexion/Marching: Both;5 reps;AAROM;Strengthening;Seated      PT Goals Acute Rehab PT Goals Time For Goal Achievement: 01/29/13 Potential to  Achieve Goals: Good Pt will Roll Supine to Right Side: with min assist PT Goal: Rolling Supine to Right Side - Progress: Progressing toward goal Pt will Roll Supine to Left Side: with min assist Pt will go Supine/Side to Sit: with min assist PT Goal: Supine/Side to Sit - Progress: Progressing toward goal Pt will go Sit to Supine/Side: with min assist PT Goal: Sit to Supine/Side - Progress: Progressing toward goal Pt will go Sit to Stand: with +2 total assist;from elevated surface Pt will go Stand to Sit: with +2 total assist;to elevated surface;with upper extremity assist Pt will Transfer Bed to Chair/Chair to Bed: with +2 total assist Pt will Ambulate: 51 - 150 feet;with least restrictive assistive device;with +2 total assist Pt will Go Up / Down Stairs: 1-2 stairs;with least restrictive assistive device;with +2 total assist Additional Goals Additional Goal #1: Pt will verbalize 3/3 back precautions  Visit Information  Last PT Received On: 01/23/13 Assistance Needed: +2    Subjective Data      Cognition  Cognition Arousal/Alertness: Awake/alert Behavior During Therapy: WFL for tasks assessed/performed Overall Cognitive Status: Within Functional Limits for tasks assessed Area of Impairment: Problem solving Problem Solving: Slow processing;Requires verbal cues General Comments: Pt unable to correct posterior lean while sitting EOB unless given VCs on how correct (to lean forward and activate core muscles).     Balance  Static Sitting Balance Static Sitting - Balance Support: Bilateral upper extremity supported;Feet supported Static Sitting - Level of Assistance: 5: Stand by assistance;4: Min assist;3: Mod assist Static Sitting - Comment/# of Minutes: Pt sat 20 mins EOB.  Needed assist for balance due to posterior lean.  Improved balance as time progressed.  (A) level required  fluctuated between SBA<>min (A).  Min (A) required when pt placed UE's in his lap & SBA for when pt  positioned UE's on therapists' shoulders directly in front of him.     End of Session PT - End of Session Patient left: in bed;with call bell/phone within reach Nurse Communication: Mobility status     Verdell Face, [place] [phone] 01/23/2013

## 2013-01-23 NOTE — Progress Notes (Signed)
Spoke with Dr. Belinda Block about patients urine output and bladder scan volumes. Was ordered to continue to bladder scan until patient has enough urine to I&O cath. Will continue to monitor patient.

## 2013-01-23 NOTE — Consult Note (Signed)
PULMONARY  / CRITICAL CARE MEDICINE  Name: [name] [name] MRN: [id number] DOB: [birth year]    ADMISSION DATE:  01/20/2013 CONSULTATION DATE:  6/6  REFERRING MD :  Lovell Sheehan  PRIMARY SERVICE: Ha   CHIEF COMPLAINT:  Post-op resp failure   BRIEF PATIENT DESCRIPTION:   57 year old white male with metastatic cancer. He underwent a thoracic laminectomy on 6/5 secondary to thoracic epidural metastasis. After surgery the patient's strength was normal. The am of 6/6 he was noted to be quite weak. The patient was brought to the operating room emergently. In OR he underwent evacuation of thoracic epidural hematoma. He returned to the ICU on the vent. PCCM asked to eval for vent weaning.   SIGNIFICANT EVENTS / STUDIES:  6/5: T1-T3 laminectomy for debulking of a thoracic epidural tumor 6/6: Evacuation of thoracic epidural hematoma   LINES / TUBES: A line 6/5>>> 6/07 OETT 6/6>>> 6/06 hemovac 6/5>>> JP 6/5>>  CULTURES: MRSA PCR 6/5: positive   ANTIBIOTICS: Ancef 6/5 (surgical prophylaxis)  SUBJECTIVE:  Denies cough, chest congestion, chest pain.  Hands are cold >> says this happens when he doesn't move too much.  VITAL SIGNS: Temp:  [97.4 F (36.3 C)-98.5 F (36.9 C)] 98 F (36.7 C) (06/07 0819) Pulse Rate:  [58-74] 69 (06/07 0800) Resp:  [9-19] 13 (06/07 0800) BP: (91-140)/(51-81) 127/81 mmHg (06/07 0800) SpO2:  [95 %-100 %] 98 % (06/07 0800) Arterial Line BP: (81-156)/(57-100) 138/100 mmHg (06/07 0800) FiO2 (%):  [30 %-40 %] 30 % (06/07 0015) 2 liters Yell  INTAKE / OUTPUT: Intake/Output     06/06 0701 - 06/07 0700 06/07 0701 - 06/08 0700   P.O. 120    I.V. (mL/kg) 1945.5 (23.2) 200 (2.4)   Blood 300    IV Piggyback     Total Intake(mL/kg) 2365.5 (28.2) 200 (2.4)   Urine (mL/kg/hr) 850 (0.4)    Drains 145 (0.1)    Blood 150 (0.1)    Total Output 1145     Net +1220.5 +200          PHYSICAL EXAMINATION: General: No distress Neuro: Awake, follows commands, moves  extremities HEENT: No sinus tenderness Cardiovascular: regular Lungs:  clear Abdomen: soft, non-tender  Musculoskeletal: no edema, hands cool/blue put radial pulses palpable Skin: no rashes  LABS:  Recent Labs Lab 01/21/13 0420 01/21/13 0843 01/22/13 0425 01/22/13 1028 01/23/13 0500  HGB 8.9*  --  9.9*  --  9.9*  WBC 5.1  --  16.4*  --  10.7*  PLT 276  --  258  --  238  NA 122*  --  122*  --  123*  K 4.6  --  4.8  --  4.8  CL 84*  --  86*  --  89*  CO2 29  --  26  --  24  GLUCOSE 131*  --  146*  --  94  BUN 9  --  18  --  27*  CREATININE 0.89  --  1.02  --  1.07  CALCIUM 9.6  --  9.2  --  8.7  INR  --  1.14  --   --   --   PHART  --   --   --  7.381  --   PCO2ART  --   --   --  41.9  --   PO2ART  --   --   --  172.0*  --     Imaging: Dg Thoracic Spine 1 View  01/21/2013   *RADIOLOGY  REPORT*  Clinical Data: Thoracic laminectomy for tumor resection  THORACIC SPINE - 1 VIEW  Comparison: MRI 01/20/2013  Findings: A single frontal image demonstrate hardware projecting over the inferior cervical/upper thoracic spine.  Left sided Port-A- Cath is noted.  Endotracheal tube appears appropriately located. Support hardware is coiled over the neck. A clamp is positioned over the level of the spinous process of C7.  IMPRESSION: Intraoperative imaging as above.   Original Report Authenticated By: Christiana Pellant, M.D.   Dg Chest Port 1 View  01/23/2013   *RADIOLOGY REPORT*  Clinical Data: Evaluate endotracheal tube.  PORTABLE CHEST - 1 VIEW  Comparison: 01/22/2013.  Findings: Tubing projecting over the trachea and right upper lobe is most compatible with a surgical drain.  The endotracheal tube has been removed.  Right IJ Port-A-Cath unchanged. Monitoring leads are projected over the chest.  Surgical clips in the left neck cardiopericardial silhouette within normal limits for projection.  Increased density in the retrocardiac region probably represents atelectasis and appears slightly improved  compared to the prior exam. No pneumothorax.  Streaky opacity in the left upper lobe compatible with the anterior left chest mass seen on recent CT.  IMPRESSION:  1.  Interval extubation.  Other support apparatus stable. 2.  Improved retrocardiac aeration compatible with improving atelectasis. 3.  Persistent left upper lobe streaky opacity consistent with tumor seen on recent CT.   Original Report Authenticated By: [name], M.D.   Dg Chest Port 1 View  01/22/2013   *RADIOLOGY REPORT*  Clinical Data: Postop.  Endotracheal tube.  PORTABLE CHEST - 1 VIEW  Comparison: 01/21/2013 intraoperative exam.  Findings: Buckling of the trachea.  The endotracheal tube is against the left lateral aspect of the buccal trachea with the tip 3 cm above the carina.  Right Mediport catheter tip distal superior vena cava.  Mediastinal drain overlies the lower cervical spine.  Surgical clips left supraclavicular region.  No gross pneumothorax.  Increased retrocardiac density may represent atelectasis. Infiltrate, effusion or mass not excluded.  This can be assessed on follow-up.  Heart slightly enlarged.  Central pulmonary vascular prominence.  IMPRESSION: Buckling of the trachea.  The endotracheal tube is against the left lateral aspect of the buccal trachea with the tip 3 cm above the carina.  Right Mediport catheter tip distal superior vena cava.  Mediastinal drain overlies the lower cervical spine.  Surgical clips left supraclavicular region.  No gross pneumothorax.  Increased retrocardiac density may represent atelectasis. Infiltrate, effusion or mass not excluded.  This can be assessed on follow-up.  Heart slightly enlarged.  Central pulmonary vascular prominence.   Original Report Authenticated By: Lacy Duverney, M.D.     ASSESSMENT / PLAN:  PULMONARY A: Post-op respiratory failure >> extubated 6/06. P:   Oxygen as needed to keep SpO2 > 92% Bronchial hygiene  CARDIOVASCULAR A: Hx of HTN. ?Raynaud's. 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 Hx of BPH with urine retention. P:   Per primary team  GASTROINTESTINAL A: Nutrition. P:   Regular diet  HEMATOLOGIC A: Squamous cell carcinoma of head and neck. Anemia of chronic disease. P:  Per primary team  INFECTIOUS A: No evidence for infection. P:   Monitor clincally  ENDOCRINE A: Steroid induced hyperglycemia. Hx of Hypothyroidism.  P:   Continue synthroid Monitor blood sugars on BMET  NEUROLOGIC A:  Thoracic Epidural hematoma w/ resultant paraplegia, s/p evac on 6/6 P:   Post-op care  per neurosurgery   Defer further management to Dr. [name] and Neurosurgery.  PCCM will sign off.  Please call if additional help needed.  Coralyn Helling, MD [facility] Pulmonary/Critical Care 01/23/2013, 10:35 AM Pager:  [phone] After 3pm call: [phone]

## 2013-01-23 NOTE — Progress Notes (Signed)
Patient ID: [name] [name], male   DOB: [birth year], 57 y.o.   MRN: [id number] BP 127/81  Pulse 69  Temp(Src) 98 F (36.7 C) (Oral)  Resp 13  Ht 5\' 11"  (1.803 m)  Wt 83.915 kg (185 lb)  BMI 25.81 kg/m2  SpO2 98% Alert and oriented x 4 Weakness in right lower extremity greater than left Dorsiflexors on right 3/5, plantar flexors similar Decreased proprioception bilaterally in lower extremities, not present at great toe bilaterally, +/- left ankle. Dressing dry and intact.  JP drain present with enough drainage to leave in today.

## 2013-01-23 NOTE — Progress Notes (Signed)
Occupational Therapy Treatment Patient Details Name: [name] [name] MRN: [id number] DOB: [birth year] Today's Date: 01/23/2013 Time: 6578-4696 OT Time Calculation (min): 32 min  OT Assessment / Plan / Recommendation Comments on Treatment Session Pt with improved activity tolerance and EOB sitting balance today. Continue to recommend CIR.    Follow Up Recommendations  CIR    Barriers to Discharge       Equipment Recommendations   (TBD)    Recommendations for Other Services    Frequency Min 2X/week   Plan Discharge plan remains appropriate    Precautions / Restrictions Precautions Precautions: Fall;Back Precaution Comments: Reviewed back precautions and log roll technique.   Pertinent Vitals/Pain See vitals    ADL  ADL Comments: Focus of session on EOB sitting balance.  Pt requiring varying levels of assist (supervision-mod assist) with verbal cueing for core muscle activation and for hand placement. Pt too fatigued after sitting 20 minutes EOB to attempt standing.    OT Diagnosis:    OT Problem List:   OT Treatment Interventions:     OT Goals Miscellaneous OT Goals Miscellaneous OT Goal #1: Pt will perform bed mobility at min assist level as precursor for EOB ADLs. OT Goal: Miscellaneous Goal #1 - Progress: Progressing toward goals Miscellaneous OT Goal #2: Pt will perform static sitting balance task with supervision >10 min as precursor for EOB ADLs. OT Goal: Miscellaneous Goal #2 - Progress: Progressing toward goals  Visit Information  Last OT Received On: 01/23/13 Assistance Needed: +2 PT/OT Co-Evaluation/Treatment: Yes    Subjective Data      Prior Functioning       Cognition  Cognition Arousal/Alertness: Awake/alert Behavior During Therapy: WFL for tasks assessed/performed Overall Cognitive Status: Within Functional Limits for tasks assessed Area of Impairment: Problem solving Problem Solving: Slow processing;Requires verbal cues General Comments: Pt  unable to correct posterior lean while sitting EOB unless given VCs on how correct (to lean forward and activate core muscles).     Mobility  Bed Mobility Bed Mobility: Rolling Right;Right Sidelying to Sit;Sit to Sidelying Right Rolling Right: 2: Max assist;With rail Right Sidelying to Sit: 1: +2 Total assist;HOB flat Right Sidelying to Sit: Patient Percentage: 30% Sitting - Scoot to Edge of Bed: 1: +2 Total assist Sitting - Scoot to Edge of Bed: Patient Percentage: 50% Sit to Sidelying Right: 1: +2 Total assist;HOB flat Sit to Sidelying Right: Patient Percentage: 30% Scooting to HOB: 1: +2 Total assist Scooting to [facility]: Patient Percentage: 0% Details for Bed Mobility Assistance: Assist for log roll technique- bending knees and bringing LEs OOB, elevating trunk off bed.    Exercises      Balance Balance Balance Assessed: Yes Static Sitting Balance Static Sitting - Balance Support: Bilateral upper extremity supported;Feet supported Static Sitting - Level of Assistance: 5: Stand by assistance;4: Min assist;3: Mod assist Static Sitting - Comment/# of Minutes: Sat 20 minutes EOB.  Pt needing assist for balance due to posterior lean.  Pt with improved balance as time progressed and as he used bil UE on therapist (positoned in front of him) or on armrest of chair positioned in front of him.    End of Session OT - End of Session Activity Tolerance: Patient tolerated treatment well Patient left: in bed;with call bell/phone within reach;with bed alarm set Nurse Communication: Mobility status  GO   01/23/2013 Cipriano Mile OTR/L Pager [phone] Office [phone]   Cipriano Mile 01/23/2013, 9:06 AM

## 2013-01-24 ENCOUNTER — Encounter: Payer: Self-pay | Admitting: Family Medicine

## 2013-01-24 MED ORDER — SODIUM CHLORIDE 0.9 % IV SOLN
INTRAVENOUS | Status: DC
  Administered 2013-01-24 – 2013-01-28 (×3): via INTRAVENOUS

## 2013-01-24 NOTE — Progress Notes (Signed)
Patient ID: [name] [name], male   DOB: June 20, 1956, 57 y.o.   MRN: [id number] BP 145/78  Pulse 72  Temp(Src) 98 F (36.7 C) (Oral)  Resp 20  Ht 5\' 11"  (1.803 m)  Wt 89.1 kg (196 lb 6.9 oz)  BMI 27.41 kg/m2  SpO2 99% Alert and oriented x 4 Wound is clean and dry, without signs of infection Slightly better strength in right lower extremity though not much. Left side is doing better.  Drain removed today.  Needs pt

## 2013-01-24 NOTE — Progress Notes (Signed)
MEDICAL ONCOLOGY 01/24/2013, 1:55 PM  Hospital day: 5 Antibiotics: none   Patient seen on call for Dr [name]. No visitors here presently. On contact isolation for positive MRSA swab.  Subjective: Sitting in bed eating, seems comfortable. Less back pain than prior to surgery, and better strength and sensation in LE since surgery. Has eaten well today. Denies other pain. Some cough but does not feel SOB. Bowels have moved.  Does not have his hearing aids.  Objective: Vital signs in last 24 hours: Blood pressure 138/71, pulse 71, temperature 98.9 F (37.2 C), temperature source Oral, resp. rate 18, height 5\' 11"  (1.803 m), weight 196 lb 6.9 oz (89.1 kg), SpO2 100.00%.   Intake/Output from previous day: 06/07 0701 - 06/08 0700 In: 500 [I.V.:500] Out: 2120 [Urine:2050; Drains:70] Intake/Output this shift: Total I/O In: 10 [I.V.:10] Out: 425 [Urine:425]  Physical exam: awake and alert, hard of hearing. Respirations not labored on [place] O2. PERRL, not icteric. Mucous membranes moist, skin turgor ok. Oral mucosa without thrush. Changes from previous surgery/ RT left neck. Lungs without wheezes or rales. Heart RRR. Abdomen soft, not tender, bowel sounds ok. LE with SCD on, no pitting edema or tenderness. Able to lift both LE off of bed and reports sensation better L>R.  Lab Results:  Recent Labs  01/22/13 0425 01/23/13 0500  WBC 16.4* 10.7*  HGB 9.9* 9.9*  HCT 28.1* 28.9*  PLT 258 238   BMET  Recent Labs  01/22/13 0425 01/23/13 0500  NA 122* 123*  K 4.8 4.8  CL 86* 89*  CO2 26 24  GLUCOSE 146* 94  BUN 18 27*  CREATININE 1.02 1.07  CALCIUM 9.2 8.7   Labs reviewed, hyponatremic and hypochloremic. No IVF running now and I have ordered NS to resume at 75/ hr at least until repeat chemistries in AM  Studies/Results: Dg Chest [facility]  01/23/2013   *RADIOLOGY REPORT*  Clinical Data: Evaluate endotracheal tube.  PORTABLE CHEST - 1 VIEW  Comparison: 01/22/2013.  Findings: Tubing  projecting over the trachea and right upper lobe is most compatible with a surgical drain.  The endotracheal tube has been removed.  Right IJ Port-A-Cath unchanged. Monitoring leads are projected over the chest.  Surgical clips in the left neck cardiopericardial silhouette within normal limits for projection.  Increased density in the retrocardiac region probably represents atelectasis and appears slightly improved compared to the prior exam. No pneumothorax.  Streaky opacity in the left upper lobe compatible with the anterior left chest mass seen on recent CT.  IMPRESSION:  1.  Interval extubation.  Other support apparatus stable. 2.  Improved retrocardiac aeration compatible with improving atelectasis. 3.  Persistent left upper lobe streaky opacity consistent with tumor seen on recent CT.   Original Report Authenticated By: [name], M.D.     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 Some neurologic improvement with this intervention, continuing decadron 4 mg po q 6 h. Disposition pending, patient hopes rehab will be closer to his home than [place].  2. Hyponatremia/ hypochloremia: resume IV NS at least overnight, repeat chemistries in AM 3.hx ETOH 4.hypothyroid, HTN, BPH continuing medications. 5.anemia stable   Dr [name] will follow up this week. [name] [name] P

## 2013-01-24 NOTE — Progress Notes (Signed)
[name] [name]   DOB:[birth year]   MV#:[id number]   MWU#:[id number]  Subjective: patient is sitting up, seems to be comfortable. He does have family members with him today.he is having minimal pain he is able to lift his legs up right basilar but weaker.   Objective:  Filed Vitals:   01/24/13 0036  BP:   Pulse:   Temp:   Resp: 14    Body mass index is 27.41 kg/(m^2).  Intake/Output Summary (Last 24 hours) at 01/24/13 0116 Last data filed at 01/23/13 1200  Gross per 24 hour  Intake 1221.5 ml  Output    720 ml  Net  501.5 ml     Sclerae unicteric  Oropharynx clear  No peripheral adenopathy  Lungs clear -- no rales or rhonchi  Heart regular rate and rhythm  Abdomen benign  Neuro: Right lower extremity weakness    CBG (last 3)  No results found for this basename: GLUCAP,  in the last 72 hours   Labs:  Lab Results  Component Value Date   WBC 10.7* 01/23/2013   HGB 9.9* 01/23/2013   HCT 28.9* 01/23/2013   MCV 86.3 01/23/2013   PLT 238 01/23/2013   NEUTROABS 5.1 01/20/2013    Urine Studies No results found for this basename: UACOL, UAPR, USPG, UPH, UTP, UGL, UKET, UBIL, UHGB, UNIT, UROB, ULEU, UEPI, UWBC, URBC, UBAC, CAST, CRYS, UCOM, BILUA,  in the last 72 hours  Basic Metabolic Panel:  Recent Labs Lab 01/20/13 0806  01/21/13 0420 01/22/13 0425 01/23/13 0500  NA 119*  --  122* 122* 123*  K 4.9  < > 4.6 4.8 4.8  CL 83*  --  84* 86* 89*  CO2 29  --  29 26 24   GLUCOSE 93  --  131* 146* 94  BUN 8.5  --  9 18 27*  CREATININE 0.9  --  0.89 1.02 1.07  CALCIUM 9.2  --  9.6 9.2 8.7  < > = values in this interval not displayed. GFR Estimated Creatinine Clearance: 81.1 ml/min (by C-G formula based on Cr of 1.07). Liver Function Tests: No results found for this basename: AST, ALT, ALKPHOS, BILITOT, PROT, ALBUMIN,  in the last 168 hours No results found for this basename: LIPASE, AMYLASE,  in the last 168 hours No results found for this basename: AMMONIA,  in the last 168  hours Coagulation profile  Recent Labs Lab 01/21/13 0843  INR 1.14    CBC:  Recent Labs Lab 01/20/13 0806 01/21/13 0420 01/22/13 0425 01/23/13 0500  WBC 6.1 5.1 16.4* 10.7*  NEUTROABS 5.1  --   --   --   HGB 7.5* 8.9* 9.9* 9.9*  HCT 22.1* 26.4* 28.1* 28.9*  MCV 87.9 85.7 84.9 86.3  PLT 246 276 258 238   Cardiac Enzymes: No results found for this basename: CKTOTAL, CKMB, CKMBINDEX, TROPONINI,  in the last 168 hours BNP: No components found with this basename: POCBNP,  CBG: No results found for this basename: GLUCAP,  in the last 168 hours D-Dimer No results found for this basename: DDIMER,  in the last 72 hours Hgb A1c No results found for this basename: HGBA1C,  in the last 72 hours Lipid Profile No results found for this basename: CHOL, HDL, LDLCALC, TRIG, CHOLHDL, LDLDIRECT,  in the last 72 hours Thyroid function studies No results found for this basename: TSH, T4TOTAL, FREET3, T3FREE, THYROIDAB,  in the last 72 hours Anemia work up No results found for this basename: VITAMINB12, FOLATE, FERRITIN,  TIBC, IRON, RETICCTPCT,  in the last 72 hours Microbiology Recent Results (from the past 240 hour(s))  SURGICAL PCR SCREEN     Status: Abnormal   Collection Time    01/21/13  8:41 AM      Result Value Range Status   MRSA, PCR POSITIVE (*) NEGATIVE Final   Comment: RESULT CALLED TO, READ BACK BY AND VERIFIED WITH:     HEATHER TRIPP,RN [id number] @ 1047 BY J SCOTTON   Staphylococcus aureus POSITIVE (*) NEGATIVE Final   Comment:            The Xpert SA Assay (FDA     approved for NASAL specimens     in patients over 32 years of age),     is one component of     a comprehensive surveillance     program.  Test performance has     been validated by [employer] for patients greater     than or equal to 58 year old.     It is not intended     to diagnose infection nor to     guide or monitor treatment.     RESULT CALLED TO, READ BACK BY AND VERIFIED WITH:      Norlene Duel [id number] @ 1047 BY J SCOTTON      Studies:  Dg Chest Port 1 View  01/23/2013   *RADIOLOGY REPORT*  Clinical Data: Evaluate endotracheal tube.  PORTABLE CHEST - 1 VIEW  Comparison: 01/22/2013.  Findings: Tubing projecting over the trachea and right upper lobe is most compatible with a surgical drain.  The endotracheal tube has been removed.  Right IJ Port-A-Cath unchanged. Monitoring leads are projected over the chest.  Surgical clips in the left neck cardiopericardial silhouette within normal limits for projection.  Increased density in the retrocardiac region probably represents atelectasis and appears slightly improved compared to the prior exam. No pneumothorax.  Streaky opacity in the left upper lobe compatible with the anterior left chest mass seen on recent CT.  IMPRESSION:  1.  Interval extubation.  Other support apparatus stable. 2.  Improved retrocardiac aeration compatible with improving atelectasis. 3.  Persistent left upper lobe streaky opacity consistent with tumor seen on recent CT.   Original Report Authenticated By: [name], M.D.   Dg Chest Port 1 View  01/22/2013   *RADIOLOGY REPORT*  Clinical Data: Postop.  Endotracheal tube.  PORTABLE CHEST - 1 VIEW  Comparison: 01/21/2013 intraoperative exam.  Findings: Buckling of the trachea.  The endotracheal tube is against the left lateral aspect of the buccal trachea with the tip 3 cm above the carina.  Right Mediport catheter tip distal superior vena cava.  Mediastinal drain overlies the lower cervical spine.  Surgical clips left supraclavicular region.  No gross pneumothorax.  Increased retrocardiac density may represent atelectasis. Infiltrate, effusion or mass not excluded.  This can be assessed on follow-up.  Heart slightly enlarged.  Central pulmonary vascular prominence.  IMPRESSION: Buckling of the trachea.  The endotracheal tube is against the left lateral aspect of the buccal trachea with the tip 3 cm above the  carina.  Right Mediport catheter tip distal superior vena cava.  Mediastinal drain overlies the lower cervical spine.  Surgical clips left supraclavicular region.  No gross pneumothorax.  Increased retrocardiac density may represent atelectasis. Infiltrate, effusion or mass not excluded.  This can be assessed on follow-up.  Heart slightly enlarged.  Central pulmonary vascular prominence.   Original Report  Authenticated By: Lacy Duverney, M.D.    Assessment: 57 y.o.with metastatic head and neck squamous cell carcinoma with metastases to lungs and thoracic vertebra. Presenting with acute cord compression status post laminectomy. Postoperatively he is doing well. He continues to have paresthesias but he does tell me that his strength is improving. Patient will eventually need radiation therapy. Dr. [name] planning on having the patient be seen at [facility].    Plan:   #1 continue to monitor patient's neurologic status.he will continue dexamethasone.  #2 hyponatremia continue to monitor he is on IV fluids  #3 eventually transferred to skilled nursing facility for rehabilitation and eventual radiation therapy in [place].   [name] [name]

## 2013-01-25 DIAGNOSIS — G893 Neoplasm related pain (acute) (chronic): Secondary | ICD-10-CM

## 2013-01-25 LAB — TYPE AND SCREEN
ABO/RH(D): A POS
Antibody Screen: NEGATIVE
Unit division: 0
Unit division: 0

## 2013-01-25 LAB — BASIC METABOLIC PANEL
CO2: 29 mEq/L (ref 19–32)
Chloride: 89 mEq/L — ABNORMAL LOW (ref 96–112)
Chloride: 87 mEq/L — ABNORMAL LOW (ref 96–112)
GFR calc Af Amer: >90 mL/min (ref >90)
GFR calc Af Amer: >90 mL/min (ref >90)
GFR calc non Af Amer: >90 mL/min (ref >90)
Glucose, Bld: 99 mg/dL (ref 70–99)
Potassium: 4.7 mEq/L (ref 3.5–5.1)
Potassium: 4.5 mEq/L (ref 3.5–5.1)
Sodium: 120 mEq/L — ABNORMAL LOW (ref 135–145)

## 2013-01-25 LAB — CBC
HCT: 29.7 % — ABNORMAL LOW (ref 39.0–52.0)
Hemoglobin: 10 g/dL — ABNORMAL LOW (ref 13.0–17.0)
WBC: 10.4 10*3/uL (ref 4.0–10.5)

## 2013-01-25 MED ORDER — MORPHINE SULFATE ER 15 MG PO TBCR
15.0000 mg | EXTENDED_RELEASE_TABLET | Freq: Two times a day (BID) | ORAL | Status: DC
  Administered 2013-01-25 – 2013-01-27 (×6): 15 mg via ORAL
  Filled 2013-01-25 (×6): qty 1

## 2013-01-25 NOTE — Progress Notes (Signed)
Patient ID: [name] [name], male   DOB: Aug 08, 1956, 57 y.o.   MRN: [id number] Subjective:  Patient is alert and pleasant. He is mildly confused. He is in no apparent distress.  Objective: Vital signs in last 24 hours: Temp:  [97.8 F (36.6 C)-98.3 F (36.8 C)] 98.3 F (36.8 C) (06/09 1100) Pulse Rate:  [65-71] 71 (06/09 1100) Resp:  [8-19] 8 (06/09 1212) BP: (134-155)/(71-78) 143/78 mmHg (06/09 1100) SpO2:  [98 %-100 %] 100 % (06/09 1212)  Intake/Output from previous day: 06/08 0701 - 06/09 0700 In: 20 [I.V.:20] Out: 4050 [Urine:4050] Intake/Output this shift: Total I/O In: -  Out: 1000 [Urine:1000]  Physical exam the patient is alert and oriented x2. His strength is fairly normal his bilateral lower extremities except he has some right dorsiflexor weakness.  Lab Results:  Recent Labs  01/23/13 0500  WBC 10.7*  HGB 9.9*  HCT 28.9*  PLT 238   BMET  Recent Labs  01/23/13 0500 01/25/13 0507  NA 123* 120*  K 4.8 4.7  CL 89* 89*  CO2 24 27  GLUCOSE 94 99  BUN 27* 19  CREATININE 1.07 0.76  CALCIUM 8.7 8.9    Studies/Results: No results found.  Assessment/Plan: Postop day #4: The patient's strength seems to be improving. I will asked rehabilitation to see the patient. I will check his sodium as is mildly confused.  LOS: 5 days     [name] [name] D 01/25/2013, 5:31 PM

## 2013-01-25 NOTE — Progress Notes (Signed)
Physical Therapy Treatment Patient Details Name: [name] [name] MRN: [id number] DOB: 19-Apr-1956 Today's Date: 01/25/2013 Time: 4098-1191 PT Time Calculation (min): 23 min  PT Assessment / Plan / Recommendation Comments on Treatment Session  Pt able to tolerate OOB activity today with +2 total (A).  Instructed Nsing to use lift for back to bed.  Per MD notes, pt prefers SNF closer to home.  D/c plans updated to SNF.      Follow Up Recommendations   SNF     Does the patient have the potential to tolerate intense rehabilitation     Barriers to Discharge        Equipment Recommendations  Wheelchair (measurements PT);Wheelchair cushion (measurements PT)    Recommendations for Other Services    Frequency Min 5X/week   Plan      Precautions / Restrictions Precautions Precautions: Fall;Back Precaution Comments: Reviewed back precautions and log roll technique.       Mobility  Bed Mobility Bed Mobility: Rolling Right;Right Sidelying to Sit;Sitting - Scoot to Edge of Bed Rolling Right: 2: Max assist;With rail Right Sidelying to Sit: 1: +1 Total assist;HOB flat;With rails Sitting - Scoot to Edge of Bed: 2: Max assist Details for Bed Mobility Assistance: Cues for sequencing & technique.  (A) to position LE's in flexed position to roll, rotation of trunk & hips into sidelying position, lifting of shoulders/trunk to sitting upright, management of LE's., & use of draw pad to scoot hips closer to EOB>   Transfers Transfers: Sit to Stand;Stand to Sit;Stand Pivot Transfers Sit to Stand: 1: +2 Total assist;From bed Sit to Stand: Patient Percentage: 30% Stand to Sit: 1: +2 Total assist;To bed;To chair/3-in-1 Stand to Sit: Patient Percentage: 30% Stand Pivot Transfers: 1: +2 Total assist Stand Pivot Transfers: Patient Percentage: 20% Details for Transfer Assistance: cues for technique.  (A) to achieve standing, blocking of bil knees, balance, faciliation at hips for trunk/hip extension,  rotation of hips with bed>recliner, & controlled descent.  Pt unable to move LE's during pivot.   Ambulation/Gait Ambulation/Gait Assistance: Not tested (comment)    Exercises     PT Diagnosis:    PT Problem List:   PT Treatment Interventions:     PT Goals Acute Rehab PT Goals Time For Goal Achievement: 01/29/13 Potential to Achieve Goals: Good Pt will Roll Supine to Right Side: with min assist PT Goal: Rolling Supine to Right Side - Progress: Progressing toward goal Pt will Roll Supine to Left Side: with min assist Pt will go Supine/Side to Sit: with min assist PT Goal: Supine/Side to Sit - Progress: Progressing toward goal Pt will go Sit to Supine/Side: with min assist Pt will go Sit to Stand: with +2 total assist;from elevated surface PT Goal: Sit to Stand - Progress: Progressing toward goal Pt will go Stand to Sit: with +2 total assist;to elevated surface;with upper extremity assist PT Goal: Stand to Sit - Progress: Progressing toward goal Pt will Transfer Bed to Chair/Chair to Bed: with +2 total assist PT Transfer Goal: Bed to Chair/Chair to Bed - Progress: Progressing toward goal Pt will Ambulate: 51 - 150 feet;with least restrictive assistive device;with +2 total assist Pt will Go Up / Down Stairs: 1-2 stairs;with least restrictive assistive device;with +2 total assist Additional Goals Additional Goal #1: Pt will verbalize 3/3 back precautions PT Goal: Additional Goal #1 - Progress: Progressing toward goal  Visit Information  Last PT Received On: 01/25/13 Assistance Needed: +2    Subjective Data  Cognition  Cognition Arousal/Alertness: Awake/alert Behavior During Therapy: WFL for tasks assessed/performed Overall Cognitive Status: Impaired/Different from baseline Area of Impairment: Problem solving Problem Solving: Slow processing General Comments: Pt seemed to repeat questions more today.      Balance     End of Session PT - End of Session Equipment  Utilized During Treatment: Gait belt Activity Tolerance: Patient tolerated treatment well Patient left: in chair;with call bell/phone within reach Nurse Communication: Mobility status;Need for lift equipment   GP     Lara Mulch 01/25/2013, 2:40 PM

## 2013-01-25 NOTE — Progress Notes (Signed)
Pt. Very lethargic rr in single didgets, although patient doesn't verbalize complaints of pain, but keeps pushing pca button.   Syringe empty, not refilled due to pateints respiratory rate.  Will monitor. [place] [employer]. SCRN

## 2013-01-25 NOTE — Progress Notes (Signed)
Noted pt prefers rehab at SNF closer to home per MD notes. Therapy continues to recommend inpt rehab consult to assess for admission. Please order if you agree. [phone]

## 2013-01-25 NOTE — Progress Notes (Signed)
Pt verbalized some discomfort, after returning to bed, medicated with Percocet and Valium, continuing to monitor o2 sat, and RR., monitor alarming at intervals, but RR is improving.  Burman Bruington [employer]

## 2013-01-25 NOTE — Progress Notes (Addendum)
Clinical Social Work Department BRIEF PSYCHOSOCIAL ASSESSMENT 01/25/2013  Patient:  [name] [name]     Account Number:  [id number]     Admit date:  01/20/2013  Clinical Social Worker:  Jacelyn Grip  Date/Time:  01/25/2013 01:30 PM  Referred by:  Physician  Date Referred:  01/25/2013 Referred for  SNF Placement   Other Referral:   Interview type:  Patient Other interview type:   and patient wife at bedside    PSYCHOSOCIAL DATA Living Status:  WIFE Admitted from facility:   Level of care:   Primary support name:  [name] [name]/wife/[phone] Primary support relationship to patient:  SPOUSE Degree of support available:   strong    CURRENT CONCERNS Current Concerns  Post-Acute Placement   Other Concerns:    SOCIAL WORK ASSESSMENT / PLAN CSW received notification from Dr. Gaylyn [name] that pt will need rehab at SNF at d/c.    CSW met with pt and pt spouse at bedside.  Pt and pt spouse agreeable to SNF search in [facility]. They are also agreeable to SNF search in [facility] for secondary options, but preference would be [facility]. Pt spouse reports that pt will likely have radiaion treatment in [place] soon.    CSW explained process of SNF placement and insurance authorization needed for SNF prior to pt d/c to SNF.    Pt wife expressed understanding.    CSW initiated SNF search to [address] and [facility].    CSW to follow up with pt and pt wife re: bed offers.    CSW to continue to follow and facilitate pt discharge needs when pt medically stable for discharge and insurance authorization received.   Assessment/plan status:  Psychosocial Support/Ongoing Assessment of Needs Other assessment/ plan:   discharge planning   Information/referral to community resources:   [place] and [facility] list.    PATIENT'S/FAMILY'S RESPONSE TO PLAN OF CARE: Pt alert and oriented x 3, but has periods of confusion. Pt spouse supportive and  actively involved in pt care. Pt and pt spoues hopeful for placement in [facility].       [name] [name], MSW, LCSWA  Clinical Social Work Coverage [phone]

## 2013-01-25 NOTE — Progress Notes (Signed)
Clinical Social Work Department CLINICAL SOCIAL WORK PLACEMENT NOTE 01/25/2013  Patient:  Doring,Keijuan  Account Number:  [id number] Admit date:  01/20/2013  Clinical Social Worker:  Jacelyn Grip  Date/time:  01/25/2013 02:00 PM  Clinical Social Work is seeking post-discharge placement for this patient at the following level of care:   SKILLED NURSING   (*CSW will update this form in Epic as items are completed)   01/25/2013  Patient/family provided with [facility] Department of Clinical Social Work's list of facilities offering this level of care within the geographic area requested by the patient (or if unable, by the patient's family).  01/25/2013  Patient/family informed of their freedom to choose among providers that offer the needed level of care, that participate in Medicare, Medicaid or managed care program needed by the patient, have an available bed and are willing to accept the patient.  01/25/2013  Patient/family informed of [facility]' ownership interest in [facility], as well as of the fact that they are under no obligation to receive care at this facility.  PASARR submitted to EDS on 01/25/2013 PASARR number received from EDS on   FL2 transmitted to all facilities in geographic area requested by pt/family on  01/25/2013 FL2 transmitted to all facilities within larger geographic area on   Patient informed that his/her managed care company has contracts with or will negotiate with  certain facilities, including the following:     Patient/family informed of bed offers received:   Patient chooses bed at  Physician recommends and patient chooses bed at    Patient to be transferred to  on   Patient to be transferred to facility by   The following physician request were entered in Epic:   Additional Comments: Pt and pt spouse 1st choice would be facility in [facility], but agreeable to [facility] search for secondary  options.   Jacklynn Lewis, MSW, LCSWA  Clinical Social Work Coverage [phone]

## 2013-01-25 NOTE — Progress Notes (Signed)
[facility] INPATIENT PROGRESS NOTE  Name: [name] [name]      MRN: [id number]    Location: 4N04C/4N04C-01  Date: 01/25/2013 Time:3:13 PM   Subjective: Interval History:[name] [name] reported that his back pain is much better than last week.  Nursing staff reported that he was using the PCA too frequently causing respiratory depression.  His PCA was discontinued.  He still has right leg weakness. Per his report, he is able to get in and out of bed with assistance.  PT has seen him and recommended rehab.  He denied fever, SOB, cough, abd pain, bleeding symptoms.   Objective: Vital signs in last 24 hours: Temp:  [97.8 F (36.6 C)-98.3 F (36.8 C)] 98.3 F (36.8 C) (06/09 1100) Pulse Rate:  [65-71] 71 (06/09 1100) Resp:  [8-19] 8 (06/09 1212) BP: (134-155)/(71-78) 143/78 mmHg (06/09 1100) SpO2:  [98 %-100 %] 100 % (06/09 1212) FiO2 (%):  [63 %] 63 % (06/08 1549)     PHYSICAL EXAM: Gen: Well-nourished man, in no acute distress. Eyes: No scleral icterus or jaundice. ENT: There was no oropharyngeal lesions. Neck was supple without thyromegaly. Lymphatics: Negative for cervical, supraclavicular, axillary, or inguinal adenopathy.  Respiratory: Lungs were clear bilaterally without wheezing or crackles. Cardiovascular: normal heart rate and rhythm; S1/S2; without murmur, rubs, or gallop. There was no pedal edema. GI: Abdomen was soft, flat, nontender, nondistended, without organomegaly. Skin exam was without ecchymosis, petechiae. Patient was alert and oriented. Attention was good. Language was appropriate. Mood was normal without depression. Speech was not pressured. Thought content was not tangential.        Studies/Results: Results for orders placed during the hospital encounter of 01/20/13 (from the past 48 hour(s))  BASIC METABOLIC PANEL     Status: Abnormal   Collection Time    01/25/13  5:07 AM      Result Value Range   Sodium 120 (*) 135 - 145 mEq/L   Potassium 4.7   3.5 - 5.1 mEq/L   Chloride 89 (*) 96 - 112 mEq/L   CO2 27  19 - 32 mEq/L   Glucose, Bld 99  70 - 99 mg/dL   BUN 19  6 - 23 mg/dL   Creatinine, Ser 0.86  0.50 - 1.35 mg/dL   Calcium 8.9  8.4 - 57.8 mg/dL   GFR calc non Af Amer >90  >90 mL/min   GFR calc Af Amer >90  >90 mL/min   Comment:            The eGFR has been calculated     using the CKD EPI equation.     This calculation has not been     validated in all clinical     situations.     eGFR's persistently     <90 mL/min signify     possible Chronic Kidney Disease.   No results found.   MEDICATIONS: reviewed.     PROBLEM LIST:  1.  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  2.  Acute cord compression. 3.  Anemia 4.  Hyponatremia.  5.  Hypertension. 6.  EtOH abuse. 7.  Hypothyroidism. 8.  BPH 9.  Depression, anxiety.   Assessment/Plan:  - Continue Dexamethasone for cord compression. - Hyponatremia most likely due to past EtOH prior to admission.  No evidence of SIADH per last week work up.  Will continue to encourage PO intake and IVF.  - I started him on MS Contin 15mg   PO BID and reserve Percocet prn for mild breakthrough pain and IV morphine prn for moderate/severe breakthrough pain.  - I personally discussed with [name] [name] from Social Work to see if patient can get placement for SNF in [place] to facilitate outpatient radiation at [facility].    FULL CODE.

## 2013-01-26 ENCOUNTER — Ambulatory Visit: Payer: Federal, State, Local not specified - PPO

## 2013-01-26 ENCOUNTER — Ambulatory Visit: Payer: Federal, State, Local not specified - PPO | Admitting: Radiation Oncology

## 2013-01-26 ENCOUNTER — Encounter (HOSPITAL_COMMUNITY): Payer: Self-pay | Admitting: Neurosurgery

## 2013-01-26 DIAGNOSIS — M7989 Other specified soft tissue disorders: Secondary | ICD-10-CM

## 2013-01-26 MED ORDER — SODIUM CHLORIDE 0.9 % IJ SOLN
10.0000 mL | INTRAMUSCULAR | Status: DC | PRN
  Administered 2013-01-26 – 2013-01-30 (×4): 10 mL

## 2013-01-26 MED ORDER — TAMSULOSIN HCL 0.4 MG PO CAPS: 0.4000 mg | ORAL_CAPSULE | Freq: Every day | ORAL | Status: DC

## 2013-01-26 MED ORDER — OXYCODONE-ACETAMINOPHEN 5-325 MG PO TABS: 1.0000 | ORAL_TABLET | ORAL | Status: AC | PRN

## 2013-01-26 MED ORDER — MORPHINE SULFATE ER 15 MG PO TBCR: 15.0000 mg | EXTENDED_RELEASE_TABLET | Freq: Two times a day (BID) | ORAL | Status: DC

## 2013-01-26 MED ORDER — ENOXAPARIN SODIUM 100 MG/ML ~~LOC~~ SOLN
1.0000 mg/kg | Freq: Two times a day (BID) | SUBCUTANEOUS | Status: DC
  Administered 2013-01-26 – 2013-01-27 (×3): 90 mg via SUBCUTANEOUS
  Filled 2013-01-26 (×6): qty 1

## 2013-01-26 MED ORDER — DSS 100 MG PO CAPS: 100.0000 mg | ORAL_CAPSULE | Freq: Two times a day (BID) | ORAL | Status: AC

## 2013-01-26 NOTE — Progress Notes (Signed)
Occupational Therapy Treatment Patient Details Name: [name] [name] MRN: [id number] DOB: [birth year] Today's Date: 01/26/2013 Time: 3086-5784 OT Time Calculation (min): 44 min  OT Assessment / Plan / Recommendation Comments on Treatment Session 57 y/o M s/p T1-T3 laminectomy for debulking of a thoracic epidural tumor on 01/21/13. He had to return to the OR 01/22/13 for evacuation fo throacic epidural hematoma. Pt shows increase activity tolerance for sitting EOB for functional activities and used [name] to transfer to chair and tolerate standing. Excellent Pt for CIR.    Follow Up Recommendations  CIR       Equipment Recommendations  Other (comment) (TBD)       Frequency Min 2X/week   Plan Discharge plan remains appropriate    Precautions / Restrictions Precautions Precautions: Fall;Back Precaution Comments: precautions with respect for bed mobility verbal and tactile cues for safety and precautions   Pertinent Vitals/Pain Pt reported 10/10 pain. RN notified.    ADL  Grooming: Performed;Teeth care;Moderate assistance Where Assessed - Grooming: Supported sitting Lower Body Bathing: Simulated;Maximal assistance Where Assessed - Lower Body Bathing: Supported sitting Lower Body Dressing: Simulated;Maximal assistance Where Assessed - Lower Body Dressing: Supported sitting Equipment Used: Gait belt;Other (comment) ([name]) Transfers/Ambulation Related to ADLs: Pt is max assist (+2) for transfers. PTA and OTS utilized [name] to facilitate sit <> stand and transfer to chair. Pt able to tolerate standing with [name] for 1 min. Pt needed verbal and tactile cues to keep head up, for back alignment, and activate BLE. ADL Comments: Pt performed oral care supported sitting at EOB with verbal and tactile cues to activate core muscles. Pt used BUE support on tray during ADL for support and when cued to use muscles would "sway" while sitting. Pt tolerated supported sitting EOB for 20 min  while performing functional activity.    OT Goals Acute Rehab OT Goals OT Goal Formulation: With patient Time For Goal Achievement: 02/05/13 Potential to Achieve Goals: Good ADL Goals Pt Will Perform Grooming: with set-up;Unsupported;Sitting, chair;Sitting, edge of bed ADL Goal: Grooming - Progress: Progressing toward goals Pt Will Perform Upper Body Bathing: with set-up;Sitting, chair;Sitting, edge of bed;Unsupported Pt Will Perform Upper Body Dressing: with set-up;Sitting, chair;Sitting, bed;Unsupported Pt Will Transfer to Toilet: with max assist;Stand pivot transfer;3-in-1 ADL Goal: Toilet Transfer - Progress: Progressing toward goals Miscellaneous OT Goals Miscellaneous OT Goal #1: Pt will perform bed mobility at min assist level as precursor for EOB ADLs. OT Goal: Miscellaneous Goal #1 - Progress: Progressing toward goals Miscellaneous OT Goal #2: Pt will perform static sitting balance task with supervision >20 min as precursor for EOB ADLs. OT Goal: Miscellaneous Goal #2 - Progress: Updated due to goal met  Visit Information  Last OT Received On: 01/26/13 Assistance Needed: +2 PT/OT Co-Evaluation/Treatment: Yes          Cognition  Cognition Arousal/Alertness: Awake/alert Behavior During Therapy: WFL for tasks assessed/performed Overall Cognitive Status: Within Functional Limits for tasks assessed    Mobility  Bed Mobility Bed Mobility: Rolling Right;Right Sidelying to Sit;Sitting - Scoot to Edge of Bed Rolling Right: 2: Max assist;With rail Right Sidelying to Sit: 1: +1 Total assist;With rails Sitting - Scoot to Edge of Bed: 2: Max assist Details for Bed Mobility Assistance: Pt is max assist for bed mobility. Please see PT note for additional bed mobility details. Transfers Transfers: Sit to Stand;Stand to Sit Sit to Stand: Other (comment) [name]) Stand to Sit: Other (comment) [name]) Transfer via Financial trader: Engineer, petroleum  for Transfer Assistance:  verbal and tactile cues for posture (head up, relax shoulders, activtate leg muscles)       Balance Static Sitting Balance Static Sitting - Balance Support: Bilateral upper extremity supported;Feet supported Static Sitting - Level of Assistance: 5: Stand by assistance;4: Min assist;3: Mod assist Static Sitting - Comment/# of Minutes: 20 mins EOB working on sitting tolerance and balance. Pt needed support at trunk up to mod assist as he fatigued to support himself EOB. Pt withincreased pain on this side of his neck and upper back. With cues he was able to lift head but said it caused 10/10 pain.   End of Session OT - End of Session Equipment Utilized During Treatment: Gait belt;Other (comment) [name]) Activity Tolerance: Patient tolerated treatment well Patient left: in chair;with call bell/phone within reach Nurse Communication: Mobility status  GO     Sherryl Manges 01/26/2013, 9:35 AM

## 2013-01-26 NOTE — Progress Notes (Signed)
I agree with the following treatment note after reviewing documentation.   Johnston, Gresia Isidoro Brynn   OTR/L Pager: [phone] Office: [phone] .   

## 2013-01-26 NOTE — Progress Notes (Signed)
Physical Therapy Treatment Patient Details Name: [name] [name] MRN: [id number] DOB: [birth year] Today's Date: 01/26/2013 Time: 4098-1191 PT Time Calculation (min): 35 min  PT Assessment / Plan / Recommendation Comments on Treatment Session  Pt cont's to require significant (A) for all mobility.  Utilized sara plus for sit<>stand & stand pivot transfer today.      Follow Up Recommendations  SNF     Does the patient have the potential to tolerate intense rehabilitation     Barriers to Discharge        Equipment Recommendations       Recommendations for Other Services    Frequency Min 5X/week   Plan Frequency remains appropriate    Precautions / Restrictions Precautions Precautions: Fall;Back Precaution Comments: precautions with respect for bed mobility verbal and tactile cues for safety and precautions   Pertinent Vitals/Pain 10/10 neck when attempting to look straight ahead.      Mobility  Bed Mobility Bed Mobility: Rolling Right;Right Sidelying to Sit;Sitting - Scoot to [place] of Bed Rolling Right: 2: Max assist Right Sidelying to Sit: 1: +1 Total assist;With rails Right Sidelying to Sit: Patient Percentage: 40% Sitting - Scoot to Edge of Bed: 2: Max assist Details for Bed Mobility Assistance: Pt cont's to require assist to position LE's to prepare for rolling, rotation of trunk/hips onto Lt side, lifting of shoulders/trunk to sitting upright, advancement of LE's off EOB, & use of draw pad to pivot hips around & scoot closer to EOB.   Transfers Transfers: Sit to Stand;Stand to [employer] Transfers Sit to Stand: Other (comment) [name]) Stand to Sit: Other (comment) [name]) Transfer via Financial trader: Hydrographic surveyor Details for Transfer Assistance: Peter Congo plus to perform sit<>stand & stand pivot bed>recliner today.  Cues & factiliation for trunk/hip/knee extension.  Performed sit<>stand 3x's.   Ambulation/Gait Ambulation/Gait Assistance: Not tested  (comment)      PT Goals Acute Rehab PT Goals Time For Goal Achievement: 01/29/13 Potential to Achieve Goals: Good Pt will Roll Supine to Right Side: with min assist Pt will Roll Supine to Left Side: with min assist PT Goal: Rolling Supine to Left Side - Progress: Progressing toward goal Pt will go Supine/Side to Sit: with min assist Pt will go Sit to Supine/Side: with min assist Pt will go Sit to Stand: with +2 total assist;from elevated surface PT Goal: Sit to Stand - Progress: Progressing toward goal Pt will go Stand to Sit: with +2 total assist;to elevated surface;with upper extremity assist PT Goal: Stand to Sit - Progress: Progressing toward goal Pt will Transfer Bed to Chair/Chair to Bed: with +2 total assist PT Transfer Goal: Bed to Chair/Chair to Bed - Progress: Progressing toward goal Pt will Ambulate: 51 - 150 feet;with least restrictive assistive device;with +2 total assist Pt will Go Up / Down Stairs: 1-2 stairs;with least restrictive assistive device;with +2 total assist Additional Goals Additional Goal #1: Pt will verbalize 3/3 back precautions  Visit Information  Last PT Received On: 01/26/13 Assistance Needed: +2    Subjective Data      Cognition  Cognition Arousal/Alertness: Awake/alert Behavior During Therapy: WFL for tasks assessed/performed Overall Cognitive Status: Within Functional Limits for tasks assessed    Balance  Static Sitting Balance Static Sitting - Balance Support: Bilateral upper extremity supported;Feet supported Static Sitting - Level of Assistance: 5: Stand by assistance;4: Min assist;3: Mod assist Static Sitting - Comment/# of Minutes: (A) needed varied between SBA & mod (A).  Pt needed support at trunk  when UE's were placed in his lap or when he performing functional task sitting EOB.  Increased (A) required as pt fatigued.  Pt set with shoulders rounded forwards & cervical flexed position.    End of Session PT - End of  Session Equipment Utilized During Treatment: Gait belt Activity Tolerance: Patient tolerated treatment well Patient left: in chair;with call bell/phone within reach Nurse Communication: Need for lift equipment     Verdell Face, PTA [phone] 01/26/2013

## 2013-01-26 NOTE — Progress Notes (Signed)
*  Preliminary Results* Left upper extremity venous duplex completed. Left upper extremity is positive for occlusive, extensive deep vein thrombosis involving the left subclavian, axillary, brachial, radial, and ulnar veins. There is also superficial vein thrombosis involving the left basilic vein.  During the exam, the patient expressed concern of increasing tightness in his right arm, and requested I examine his right arm as well. Right upper extremity venous duplex revealed acute, nonocclusive deep vein thrombosis of the brachial veins.   Preliminary results discussed with Evea, RN and Dr.Murinson.  01/26/2013 6:19 PM Gertie Fey, RVT, RDCS, RDMS

## 2013-01-26 NOTE — Progress Notes (Signed)
Physical medicine and rehabilitation consult requested. Chart has been reviewed. Patient's adamant request is for discharge closer to home in [place] to be closer to family. Recommendations have been made for skilled nursing facility as per social worker note. Will hold on formal rehabilitation consult at this time

## 2013-01-26 NOTE — Discharge Summary (Signed)
Physician Discharge Summary  Patient ID: [name] [name] MRN: [id number] DOB/AGE: 58-Jul-1957 57 y.o.  Admit date: 01/20/2013 Discharge date: 01/26/2013  Admission Diagnoses: T1-T3 thoracic epidural metastasis, paraparesis, metastatic cancer  Discharge Diagnoses: The same Active Problems:   Acute respiratory failure   Discharged Condition: fair  Hospital Course: The patient was transferred from was a long hospital to [facility] on 01/20/2013. He underwent a thoracic laminectomy for debulking of thoracic epidural tumor on 01/21/2013. Initially the patient did well but approximately 12 hours later he became increasingly more weak in his lower extremities. He was taken back to the OR for drainage of epidural hematoma.  The patient regained his strength. The remainder of the patient's hospital course was unremarkable. We had PT and OT see the patient. The patient requested a skilled facility placement in [place]. Arrangements were made.  Consults: None Significant Diagnostic Studies: None Treatments: C6 laminectomy for debulking of thoracic epidural metastasis; thoracic laminectomy for removal of epidural hematoma Discharge Exam: Blood pressure 127/73, pulse 73, temperature 97.5 F (36.4 C), temperature source Oral, resp. rate 20, height 5\' 11"  (1.803 m), weight 89.1 kg (196 lb 6.9 oz), SpO2 99.00%. The patient is alert and pleasant. He is mildly weak in his bilateral lower extremities and 4+ over 5 except in his right dorsiflexor/EHL is approximately 2-3/5. His wound is healing well without signs of infection  Disposition: Skilled nursing facility  Discharge Orders   Future Appointments Provider Department Dept Phone   2013-02-24 9:00 AM [facility] Nurse [facility] MEDICAL ONCOLOGY [phone]   03/18/2013 9:00 AM [facility] Nurse [facility] [phone]   04/05/2013 8:30 AM [facility] [phone]   Patient to arrive 15 minutes prior to appointment time. No solid food 4 hours prior to exam. Liquids and Medicines are okay.   04/05/2013 9:00 AM Krista Blue [facility] MEDICAL ONCOLOGY [phone]   04/07/2013 9:00 AM Exie Parody, MD [facility] MEDICAL ONCOLOGY [phone]   06/07/2013 8:00 AM Eustaquio Boyden, MD [facility] [phone]   Future Orders Complete By Expires     Call MD for:  difficulty breathing, headache or visual disturbances  As directed     Call MD for:  extreme fatigue  As directed     Call MD for:  hives  As directed     Call MD for:  persistant dizziness or light-headedness  As directed     Call MD for:  persistant nausea and vomiting  As directed     Call MD for:  redness, tenderness, or signs of infection (pain, swelling, redness, odor or green/yellow discharge around incision site)  As directed     Call MD for:  severe uncontrolled pain  As directed     Call MD for:  temperature >100.4  As directed     Diet - low sodium heart healthy  As directed     Discharge instructions  As directed     Comments:      Call [phone] for a followup appointment. Take a stool softener while you are using pain medications.    Driving Restrictions  As directed     Comments:      Do not drive for 2 weeks.    Increase activity slowly  As directed     Lifting restrictions  As directed     Comments:      Do not  lift more than 5 pounds. No excessive bending or twisting.    May shower / Bathe  As directed     Comments:      He may shower after the pain she is removed 3 days after surgery. Leave the incision alone.    No dressing needed  As directed         Medication List    TAKE these medications       aspirin 81 MG tablet  Take 81 mg by mouth every morning.     DSS 100 MG Caps  Take 100 mg by mouth 2 (two) times daily.     escitalopram 20 MG tablet  Commonly known as:  LEXAPRO  TAKE 1  TABLET BY MOUTH EVERY DAY     levothyroxine 50 MCG tablet  Commonly known as:  SYNTHROID, LEVOTHROID  Take 50-100 mcg by mouth See admin instructions. 50 mcg daily with the exception of 2 days out the week patient takes 100 mcg (no specific day)     losartan 100 MG tablet  Commonly known as:  COZAAR     magnesium gluconate 500 MG tablet  Commonly known as:  MAGONATE  Take 500 mg by mouth every evening.     morphine 15 MG 12 hr tablet  Commonly known as:  MS CONTIN  Take 1 tablet (15 mg total) by mouth every 12 (twelve) hours.     naproxen sodium 220 MG tablet  Commonly known as:  ANAPROX  Take 440 mg by mouth every morning.     NON FORMULARY  Take 30 mLs by mouth every morning. Flor-essence 2 TBSP     omeprazole 40 MG capsule  Commonly known as:  PRILOSEC  Take 40 mg by mouth every morning.     ondansetron 8 MG tablet  Commonly known as:  ZOFRAN  Take 8 mg by mouth every 8 (eight) hours as needed for nausea.     oxyCODONE-acetaminophen 5-325 MG per tablet  Commonly known as:  PERCOCET/ROXICET  Take 1-2 tablets by mouth every 4 (four) hours as needed.     PROAIR HFA IN  Inhale 1 puff into the lungs every 6 (six) hours as needed (FOR SHORTNESS OF BREATH).     prochlorperazine 10 MG tablet  Commonly known as:  COMPAZINE  Take 10 mg by mouth every 6 (six) hours as needed (nausea).     risperiDONE 0.25 MG tablet  Commonly known as:  RISPERDAL  Take 0.25 mg by mouth every evening.     tamsulosin 0.4 MG Caps  Commonly known as:  FLOMAX  Take 0.4 mg by mouth every evening.     tamsulosin 0.4 MG Caps  Commonly known as:  FLOMAX  Take 1 capsule (0.4 mg total) by mouth daily.     Vitamin D3 5000 UNITS Tabs  Take 2 tablets by mouth every evening.         SignedTressie Stalker D 01/26/2013, 8:07 AM

## 2013-01-26 NOTE — Progress Notes (Signed)
Vascular lab here at 1800 to do doppler,  Results called to Dr. [name], on call physician called orders for patient. Will be started on sa lovenox q 12 hrs.  Also received order from Dr.  Othelia Pulling to reinsert Foley cath as patient has been unable to void.    14 fr, foley cath inserted per sterile technique, return of clear yellow urine without difficulties.  Pt restingqueitly in bed. Brytani Voth Charity fundraiser. SCRN

## 2013-01-26 NOTE — Progress Notes (Signed)
[facility] INPATIENT PROGRESS NOTE  Name: [name] [name]      MRN: [id number]    Location: 4N04C/4N04C-01  Date: 01/26/2013 Time:2:16 PM   Subjective: Interval History:[name] [name] reported left arm swelling since last night.  His back pain is mild to moderate.  He is slowly regaining the strength in his right leg.  He denied fever, mucositis, nausea/vomiting, bleeding, bowel/bladder incontinence.    Objective: Vital signs in last 24 hours: Temp:  [97.2 F (36.2 C)-98.2 F (36.8 C)] 98.2 F (36.8 C) (06/10 0950) Pulse Rate:  [64-84] 68 (06/10 0950) Resp:  [14-20] 16 (06/10 0950) BP: (106-177)/(55-84) 106/55 mmHg (06/10 0950) SpO2:  [77 %-99 %] 94 % (06/10 0950)     PHYSICAL EXAM: Gen: Well-nourished man, in no acute distress. Eyes: No scleral icterus or jaundice. ENT: There was no oropharyngeal lesions. Neck was supple without thyromegaly. Lymphatics: Negative for cervical, supraclavicular, axillary, or inguinal adenopathy.  Respiratory: Lungs were clear bilaterally without wheezing or crackles. Cardiovascular: normal heart rate and rhythm; S1/S2; without murmur, rubs, or gallop. There was no pedal edema. Left arm was swollen from shoulder to wrist without palpable cords.  GI: Abdomen was soft, flat, nontender, nondistended, without organomegaly. Skin exam was without ecchymosis, petechiae. Patient was alert and oriented. Attention was good. Language was appropriate. Mood was normal without depression. Speech was not pressured. Thought content was not tangential.        Studies/Results: Results for orders placed during the hospital encounter of 01/20/13 (from the past 48 hour(s))  BASIC METABOLIC PANEL     Status: Abnormal   Collection Time    01/25/13  5:07 AM      Result Value Range   Sodium 120 (*) 135 - 145 mEq/L   Potassium 4.7  3.5 - 5.1 mEq/L   Chloride 89 (*) 96 - 112 mEq/L   CO2 27  19 - 32 mEq/L   Glucose, Bld 99  70 - 99 mg/dL   BUN 19  6 - 23 mg/dL    Creatinine, Ser 4.01  0.50 - 1.35 mg/dL   Calcium 8.9  8.4 - 02.7 mg/dL   GFR calc non Af Amer >90  >90 mL/min   GFR calc Af Amer >90  >90 mL/min   Comment:            The eGFR has been calculated     using the CKD EPI equation.     This calculation has not been     validated in all clinical     situations.     eGFR's persistently     <90 mL/min signify     possible Chronic Kidney Disease.  BASIC METABOLIC PANEL     Status: Abnormal   Collection Time    01/25/13  6:00 PM      Result Value Range   Sodium 121 (*) 135 - 145 mEq/L   Potassium 4.5  3.5 - 5.1 mEq/L   Chloride 87 (*) 96 - 112 mEq/L   CO2 29  19 - 32 mEq/L   Glucose, Bld 124 (*) 70 - 99 mg/dL   BUN 17  6 - 23 mg/dL   Creatinine, Ser 2.53  0.50 - 1.35 mg/dL   Calcium 8.3 (*) 8.4 - 10.5 mg/dL   GFR calc non Af Amer >90  >90 mL/min   GFR calc Af Amer >90  >90 mL/min   Comment:            The eGFR has  been calculated     using the CKD EPI equation.     This calculation has not been     validated in all clinical     situations.     eGFR's persistently     <90 mL/min signify     possible Chronic Kidney Disease.  CBC     Status: Abnormal   Collection Time    01/25/13  6:00 PM      Result Value Range   WBC 10.4  4.0 - 10.5 K/uL   RBC 3.35 (*) 4.22 - 5.81 MIL/uL   Hemoglobin 10.0 (*) 13.0 - 17.0 g/dL   HCT 45.4 (*) 09.8 - 11.9 %   MCV 88.7  78.0 - 100.0 fL   MCH 29.9  26.0 - 34.0 pg   MCHC 33.7  30.0 - 36.0 g/dL   RDW 14.7  82.9 - 56.2 %   Platelets 225  150 - 400 K/uL   No results found.   MEDICATIONS: reviewed.     PROBLEM LIST:  1.  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  2.  Acute cord compression. 3.  Anemia 4.  Hyponatremia.  5.  Hypertension. 6.  EtOH abuse. 7.  Hypothyroidism. 8.  BPH 9.  Depression, anxiety. 10.  Left arm swelling.    Assessment/Plan:  - Continue Dexamethasone for cord compression. - Hyponatremia most likely due to past  EtOH prior to admission.  He is taking PO.  - He is on MS Contin 15mg  PO BID for long acting and reserve Percocet prn for mild breakthrough pain and IV morphine prn for moderate/severe breakthrough pain.  - I requested left arm Doppler with [place] to rule out  DVT.  - Pending d/c to SNF in [facility] for palliative radiation and then eventually chemotherapy.   FULL CODE.

## 2013-01-26 NOTE — Progress Notes (Signed)
SNF bed offers presented and patient has accepted bed at [facility]- patient's insurance will require SNF auth and this is being processed and await final auth before d/c.  Reece Levy, MSW, Theresia Majors [phone]

## 2013-01-26 NOTE — Progress Notes (Signed)
Patient ID: [name] [name], male   DOB: 1956/03/30, 57 y.o.   MRN: [id number] Subjective:  The patient is alert and pleasant. He looks better this morning. He wants to go to a skilled nursing facility in [place].  Objective: Vital signs in last 24 hours: Temp:  [97.2 F (36.2 C)-98.3 F (36.8 C)] 97.5 F (36.4 C) (06/10 0614) Pulse Rate:  [64-84] 73 (06/10 0614) Resp:  [8-20] 20 (06/10 0614) BP: (118-177)/(64-84) 127/73 mmHg (06/10 0614) SpO2:  [77 %-100 %] 99 % (06/10 0614)  Intake/Output from previous day: 06/09 0701 - 06/10 0700 In: -  Out: 3200 [Urine:3200] Intake/Output this shift:    Physical exam the patient is alert and oriented. His strength is partially 4+ over 5 his lateral quadriceps, gastrocnemius, left extensor hallucis longus. His right EHL and dorsiflexors are weak.  The patient's wound is healing well.  Lab Results:  Recent Labs  01/25/13 1800  WBC 10.4  HGB 10.0*  HCT 29.7*  PLT 225   BMET  Recent Labs  01/25/13 0507 01/25/13 1800  NA 120* 121*  K 4.7 4.5  CL 89* 87*  CO2 27 29  GLUCOSE 99 124*  BUN 19 17  CREATININE 0.76 0.72  CALCIUM 8.9 8.3*    Studies/Results: No results found.  Assessment/Plan: Postop day #5: The patient is neurologically stable and ready for transfer to a skilled nursing facility from my point of view. Arrangements are being made.  Urinary retention: We will continue the patient's catheter today.  LOS: 6 days     Zyrion Coey D 01/26/2013, 8:02 AM

## 2013-01-27 DIAGNOSIS — I82629 Acute embolism and thrombosis of deep veins of unspecified upper extremity: Secondary | ICD-10-CM

## 2013-01-27 NOTE — Progress Notes (Signed)
[facility] INPATIENT PROGRESS NOTE  Name: [name] [name]      MRN: [id number]    Location: 4N04C/4N04C-01  Date: 01/27/2013 Time:10:01 PM   Subjective: Interval History:[name] [name] reported left arm swelling has slightly improved with Lovenox.  He denied neck pain, visible bleeding symptoms.  He was able to work with PT and reported that he was able to bear weight but not enough strength in the right leg to ambulate yet.   Objective: Vital signs in last 24 hours: Temp:  [97.8 F (36.6 C)-98.2 F (36.8 C)] 98.2 F (36.8 C) (06/11 1832) Pulse Rate:  [66-75] 71 (06/11 1832) Resp:  [16-18] 18 (06/11 1832) BP: (130-211)/(65-88) 140/65 mmHg (06/11 1832) SpO2:  [95 %-100 %] 100 % (06/11 1832)     PHYSICAL EXAM: Gen: Well-nourished man, in no acute distress. Eyes: No scleral icterus or jaundice. ENT: There was no oropharyngeal lesions. Neck was supple without thyromegaly. There was dry blood on the dressings from thoracotomy.  The wound appeared slightly edematous without active bleeding, purulent discharge. Lymphatics: Negative for cervical, supraclavicular, axillary, or inguinal adenopathy.  Respiratory: Lungs were clear bilaterally without wheezing or crackles. Cardiovascular: normal heart rate and rhythm; S1/S2; without murmur, rubs, or gallop. There was no pedal edema. Left arm was swollen from shoulder to wrist without palpable cords.  GI: Abdomen was soft, flat, nontender, nondistended, without organomegaly. Skin exam was without ecchymosis, petechiae. Patient was alert and oriented. Attention was good. Language was appropriate. Mood was normal without depression. Speech was not pressured. Thought content was not tangential.       MEDICATIONS: reviewed.     PROBLEM LIST:  1.  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  2.  Acute cord compression. 3.  Anemia 4.  Hyponatremia.  5.  Hypertension. 6.  EtOH abuse. 7.   Hypothyroidism. 8.  BPH 9.  Depression, anxiety. 10.  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   Assessment/Plan:  - Continue Dexamethasone for cord compression. - He is on MS Contin 15mg  PO BID for long acting and reserve Percocet prn for mild breakthrough pain and IV morphine prn for moderate/severe breakthrough pain.  - Continue Lovenox 90mg  SQ BID.  Will monitor to ensure no severe bleeding.  He will need chemotherapy to eventually improve the left thoracic outlet obstruction to long term control of the DVT.  Per our [employer], he is not candidate for repeat radiation of the left thoracic outlet since he had radiation to this area before in [place].  - Pending d/c to SNF in [facility] for palliative radiation and then eventually chemotherapy.   If there is sign of stability or improvement of his DVT and no active bleeding, consideration for DC to SNF tomorrow.   FULL CODE.

## 2013-01-27 NOTE — Progress Notes (Signed)
Patient ID: [name] [name], male   DOB: [birth year], 57 y.o.   MRN: [id number] Subjective:  A she is alert and pleasant. He looks well.  Objective: Vital signs in last 24 hours: Temp:  [97.8 F (36.6 C)-98.1 F (36.7 C)] 98 F (36.7 C) (06/11 1333) Pulse Rate:  [66-99] 72 (06/11 1333) Resp:  [16-20] 18 (06/11 1333) BP: (130-211)/(70-88) 133/74 mmHg (06/11 1333) SpO2:  [95 %-100 %] 100 % (06/11 1333)  Intake/Output from previous day: 06/10 0701 - 06/11 0700 In: -  Out: 4275 [Urine:4275] Intake/Output this shift: Total I/O In: 720 [P.O.:720] Out: -   Physical exam the patient is alert and oriented x3. His strength is grossly normal except his right gastroc and EHL is weak in approximately 2-3/5 without change.  Lab Results:  Recent Labs  01/25/13 1800  WBC 10.4  HGB 10.0*  HCT 29.7*  PLT 225   BMET  Recent Labs  01/25/13 0507 01/25/13 1800  NA 120* 121*  K 4.7 4.5  CL 89* 87*  CO2 27 29  GLUCOSE 99 124*  BUN 19 17  CREATININE 0.76 0.72  CALCIUM 8.9 8.3*    Studies/Results: No results found.  Assessment/Plan: Postop day #5: The patient is doing well neurologically. He is awaiting skilled nursing facility placement.  Upper extremity deep venous thrombosis: The patient has been started on Lovenox. I discussed the risk of bleeding, patient's with this particularly in light of his postoperative epidural hematoma. At this point the benefits of Lovenox seemed outweighed the risk.  LOS: 7 days     [name] [name] D 01/27/2013, 3:47 PM

## 2013-01-27 NOTE — Progress Notes (Signed)
SNF auth rec'd for patient to go to [facility] per SNF rep- can plan for admit tomorrow- will advise charge RN. Reece Levy, MSW, Theresia Majors [phone]

## 2013-01-27 NOTE — Progress Notes (Signed)
Physical Therapy Treatment Patient Details Name: [name] [name] MRN: [id number] DOB: Jun 03, 1956 Today's Date: 01/27/2013 Time: 4098-1191 PT Time Calculation (min): 25 min  PT Assessment / Plan / Recommendation Comments on Treatment Session   Pt is very motivated & eager to make progression with mobility.      Follow Up Recommendations  SNF     Does the patient have the potential to tolerate intense rehabilitation     Barriers to Discharge        Equipment Recommendations       Recommendations for Other Services    Frequency Min 5X/week   Plan Frequency remains appropriate;Discharge plan remains appropriate    Precautions / Restrictions Precautions Precautions: Fall;Back Precaution Comments: Reviewed back precautions   Pertinent Vitals/Pain Pt with bil UE DVT however RN ok'd to proceed with therapy at this time.      Mobility  Bed Mobility Bed Mobility: Rolling Left;Left Sidelying to Sit;Sitting - Scoot to Edge of Bed Rolling Left: 3: Mod assist;With rail Right Sidelying to Sit: 1: +1 Total assist;HOB flat;With rails Right Sidelying to Sit: Patient Percentage: 50% Sitting - Scoot to Edge of Bed: 2: Max assist Details for Bed Mobility Assistance: Pt was able to recall sequencing for bed mobility with min cueing.  cont's to require significant (A) for LE's & to lift shoulders/trunk to sitting upright.   Transfers Transfers: Sit to Stand;Stand to [employer] Transfers Sit to Stand: From bed;From chair/3-in-1 Stand to Sit: To chair/3-in-1 Transfer via Lift Equipment: Kandee Keen Details for Transfer Assistance: Peter Congo plus again in today's session.  Faciliation required for hip extension & knee extension.  Performed 4x's & stood 30 seconds 1st 2 trials, 1 min 3rd trial, & 1.5 mins 4 trial.   Ambulation/Gait Ambulation/Gait Assistance: Not tested (comment)      PT Goals Acute Rehab PT Goals PT Goal Formulation: With patient Time For Goal Achievement:  01/29/13 Potential to Achieve Goals: Good Pt will Roll Supine to Right Side: with min assist Pt will Roll Supine to Left Side: with min assist PT Goal: Rolling Supine to Left Side - Progress: Progressing toward goal Pt will go Supine/Side to Sit: with min assist Pt will go Sit to Supine/Side: with min assist Pt will go Sit to Stand: with +2 total assist;from elevated surface PT Goal: Sit to Stand - Progress: Progressing toward goal Pt will go Stand to Sit: with +2 total assist;to elevated surface;with upper extremity assist PT Goal: Stand to Sit - Progress: Progressing toward goal Pt will Transfer Bed to Chair/Chair to Bed: with +2 total assist PT Transfer Goal: Bed to Chair/Chair to Bed - Progress: Progressing toward goal Pt will Ambulate: 51 - 150 feet;with least restrictive assistive device;with +2 total assist Pt will Go Up / Down Stairs: 1-2 stairs;with least restrictive assistive device;with +2 total assist Additional Goals Additional Goal #1: Pt will verbalize 3/3 back precautions PT Goal: Additional Goal #1 - Progress: Progressing toward goal  Visit Information  Last PT Received On: 01/27/13 Assistance Needed: +2    Subjective Data      Cognition  Cognition Arousal/Alertness: Awake/alert Behavior During Therapy: WFL for tasks assessed/performed Overall Cognitive Status: Within Functional Limits for tasks assessed    Balance     End of Session PT - End of Session Equipment Utilized During Treatment: Gait belt;Other (comment) (sara plus) Activity Tolerance: Patient tolerated treatment well Patient left: in chair;with call bell/phone within reach Nurse Communication: Mobility status;Need for lift equipment  Verdell Face, [place] [phone] 01/27/2013

## 2013-01-27 NOTE — Progress Notes (Signed)
Spoke with patient's [employer] CM who is working with area SNF facilities to negotiate a rate with a SNF as they do not have a SNF benefit. He has spoken with the patient and with the wife- we will proceed and advise-  Reece Levy, MSW, Theresia Majors [phone]

## 2013-01-28 ENCOUNTER — Inpatient Hospital Stay (HOSPITAL_COMMUNITY): Payer: Federal, State, Local not specified - PPO

## 2013-01-28 ENCOUNTER — Encounter (HOSPITAL_COMMUNITY): Payer: Self-pay

## 2013-01-28 DIAGNOSIS — G9519 Other vascular myelopathies: Secondary | ICD-10-CM

## 2013-01-28 DIAGNOSIS — D492 Neoplasm of unspecified behavior of bone, soft tissue, and skin: Secondary | ICD-10-CM

## 2013-01-28 DIAGNOSIS — Z66 Do not resuscitate: Secondary | ICD-10-CM

## 2013-01-28 DIAGNOSIS — M7989 Other specified soft tissue disorders: Secondary | ICD-10-CM

## 2013-01-28 DIAGNOSIS — R0609 Other forms of dyspnea: Secondary | ICD-10-CM

## 2013-01-28 DIAGNOSIS — K625 Hemorrhage of anus and rectum: Secondary | ICD-10-CM

## 2013-01-28 DIAGNOSIS — D500 Iron deficiency anemia secondary to blood loss (chronic): Secondary | ICD-10-CM

## 2013-01-28 LAB — URINALYSIS, ROUTINE W REFLEX MICROSCOPIC
Bilirubin Urine: NEGATIVE
Specific Gravity, Urine: 1.016 (ref 1.005–1.030)
pH: 7 (ref 5.0–8.0)

## 2013-01-28 LAB — CBC
Hemoglobin: 6.2 g/dL — CL (ref 13.0–17.0)
Hemoglobin: 6.5 g/dL — CL (ref 13.0–17.0)
MCH: 29.7 pg (ref 26.0–34.0)
MCH: 30 pg (ref 26.0–34.0)
MCHC: 35.3 g/dL (ref 30.0–36.0)
MCV: 84.5 fL (ref 78.0–100.0)
Platelets: 195 10*3/uL (ref 150–400)
Platelets: 166 10*3/uL (ref 150–400)
Platelets: 175 10*3/uL (ref 150–400)
RBC: 2.09 MIL/uL — ABNORMAL LOW (ref 4.22–5.81)
RBC: 2.17 MIL/uL — ABNORMAL LOW (ref 4.22–5.81)
RBC: 2.06 MIL/uL — ABNORMAL LOW (ref 4.22–5.81)
RDW: 13.6 % (ref 11.5–15.5)
WBC: 11.3 10*3/uL — ABNORMAL HIGH (ref 4.0–10.5)
WBC: 10.5 10*3/uL (ref 4.0–10.5)

## 2013-01-28 LAB — URINE MICROSCOPIC-ADD ON

## 2013-01-28 LAB — OSMOLALITY, URINE: Osmolality, Ur: 352 mOsm/kg — ABNORMAL LOW (ref 390–1090)

## 2013-01-28 LAB — BASIC METABOLIC PANEL
CO2: 31 mEq/L (ref 19–32)
Calcium: 8.5 mg/dL (ref 8.4–10.5)
Chloride: 82 mEq/L — ABNORMAL LOW (ref 96–112)
Potassium: 4.8 mEq/L (ref 3.5–5.1)
Sodium: 117 mEq/L — CL (ref 135–145)

## 2013-01-28 LAB — PREPARE RBC (CROSSMATCH)

## 2013-01-28 MED ORDER — NALOXONE HCL 0.4 MG/ML IJ SOLN: 0.4000 mg | INTRAMUSCULAR | Status: DC | PRN

## 2013-01-28 MED ORDER — NALOXONE HCL 0.4 MG/ML IJ SOLN
INTRAMUSCULAR | Status: AC
  Administered 2013-01-28: 16:00:00
  Filled 2013-01-28: qty 1

## 2013-01-28 MED ORDER — IOHEXOL 300 MG/ML  SOLN: 80.0000 mL | Freq: Once | INTRAMUSCULAR | Status: DC | PRN

## 2013-01-28 MED ORDER — BIOTENE DRY MOUTH MT LIQD
15.0000 mL | Freq: Two times a day (BID) | OROMUCOSAL | Status: DC
  Administered 2013-01-28 – 2013-01-30 (×5): 15 mL via OROMUCOSAL

## 2013-01-28 MED ORDER — MORPHINE SULFATE 2 MG/ML IJ SOLN
2.0000 mg | INTRAMUSCULAR | Status: DC | PRN
  Administered 2013-01-28: 2 mg via INTRAVENOUS
  Filled 2013-01-28: qty 1

## 2013-01-28 MED ORDER — FENTANYL CITRATE 0.05 MG/ML IJ SOLN
INTRAMUSCULAR | Status: AC
  Filled 2013-01-28: qty 4

## 2013-01-28 MED ORDER — MORPHINE SULFATE 2 MG/ML IJ SOLN: 0.5000 mg | INTRAMUSCULAR | Status: DC | PRN

## 2013-01-28 MED ORDER — CHLORHEXIDINE GLUCONATE 0.12 % MT SOLN
15.0000 mL | Freq: Two times a day (BID) | OROMUCOSAL | Status: DC
  Administered 2013-01-28 – 2013-01-29 (×3): 15 mL via OROMUCOSAL
  Filled 2013-01-28 (×4): qty 15

## 2013-01-28 MED ORDER — FUROSEMIDE 10 MG/ML IJ SOLN
20.0000 mg | Freq: Two times a day (BID) | INTRAMUSCULAR | Status: DC
  Administered 2013-01-28 – 2013-01-29 (×3): 20 mg via INTRAVENOUS
  Filled 2013-01-28 (×6): qty 2

## 2013-01-28 MED ORDER — MIDAZOLAM HCL 2 MG/2ML IJ SOLN
INTRAMUSCULAR | Status: AC
  Filled 2013-01-28: qty 4

## 2013-01-28 MED FILL — Medication: Qty: 1 | Status: AC

## 2013-01-28 NOTE — Progress Notes (Signed)
Bilateral lower extremity venous duplex:  No evidence of DVT, superficial thrombosis, or Baker's Cyst.   

## 2013-01-28 NOTE — Progress Notes (Signed)
eLink Physician-Brief Progress Note Patient Name: Ayson Cherubini DOB: 1956/08/12 MRN: [id number]  Date of Service  01/28/2013   HPI/Events of Note   Symptomatic anemia DNR, but full medical care  eICU Interventions  Transfuse 1 unit   Intervention Category Intermediate Interventions: Bleeding - evaluation and treatment with blood products  Demeshia Sherburne V. 01/28/2013, 11:39 PM

## 2013-01-28 NOTE — Progress Notes (Signed)
1545 pt c/o 9/10 back pain MS 2mg  iv given, pt became non responsive with minimal resp effort Narcan 0.4 given with return to baseline

## 2013-01-28 NOTE — Progress Notes (Signed)
Responded to code blue. Pt's family was unavailable at the time. Referred pt to unit Chaplain. Marjory Lies Chaplain

## 2013-01-28 NOTE — Evaluation (Signed)
Clinical/Bedside Swallow Evaluation Patient Details  Name: [name] [name] MRN: [id number] Date of Birth: 05-08-1956  Today's Date: 01/28/2013 Time: 1250-1315 SLP Time Calculation (min): 25 min  Past Medical History:  Past Medical History  Diagnosis Date  . Squamous cell carcinoma of head and neck 2011    Unknown primary. He presented with right sided ear pain. Biopsy in the neck nodes showed squamous cell carcinoma. He underwent neck dissection then chemoradiation. He developed lung nodules in August 2012. He received carboplatin, 5-FU, cetuximab which in October 2012 until may 2013 with multiple dose delays and reductions because of toxicities. He had disease progression in the lungs in may 2013.  Marland Kitchen Hearing loss     wears aides  . Hypothyroid   . Hypertension   . Gastroesophageal reflux disease     well controlled on omeprazole  . Obstructive sleep apnea     not using CPAP  . Anemia   . Dysphagia   . BPH (benign prostatic hyperplasia)   . Restlessness and agitation     unclear etiology.  His previous physicians gave him  Risperdal.   . Urge incontinence   . Alcohol abuse   . History of smoking   . Emphysema 2013    mild centrilobular per CT  . Lung metastases 5/22014  . 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 Hyponatremia 07/2012    h/o beer drinker's potomania with low ADH   Past Surgical History:  Past Surgical History  Procedure Laterality Date  . Modified radical neck dissection  2011    left side.   . Thyroidectomy, partial  2011  . Lung biopsy    . Other surgical history  2008, 2009    L femur rod  . Other surgical history  2008    R radius  . Thoracic spine surgery  01/2013    debluking of tumor  . Laminectomy N/A 01/21/2013    Procedure: THORACIC LAMINECTOMY FOR TUMOR;  Surgeon: Cristi Loron, MD;  Location: [facility];  Service: Neurosurgery;  Laterality: N/A;  . Wound exploration N/A 01/22/2013    Procedure: WOUND  EXPLORATION;  Surgeon: Cristi Loron, MD;  Location: [facility];  Service: Neurosurgery;  Laterality: N/A;  Thoracic one -thoracic three   HPI:  57 y.o. male is seen for a work-in appointment today with his wife. He was seen earlier today by Dr Basilio Cairo in Radiation Oncology to discuss palliative XRT. There was concern for cord compression given his report of numbness/weaknes in his lower extremities.  He was having difficulty walking and his wife gave him a cane to use. Weakness has progressively worsened and he is now requiring assistance from wife with walking to the bathroom and dressing himself. PMH:  head and neck CA, GERD, HTN, dysphagia, ETOH abuse, emphysema, lung mets.  CXR 6/12 revealed question residual air space disease in the left lower lobe.  Wife reports pt. "may cough if he doesn't take his time."   Assessment / Plan / Recommendation Clinical Impression  Swallow assessment completed with wife present.  Consult initiated due to decreased LOC and in CT this am with transfer to ICU and cough with liquids via straw with RN after episode.  Pt. has dysphagia since 2011 after radiation for neck cancer.  She reports recently he has been eating pizza and drinking beer. Wife described pt.'s history of dysphagia and baseline swallow status as needing to eat slow and take his time to  avoid coughing during meals.  Pt. exhibiting mostly delayed throat clears (several immediate) and 3-4 multiple swallows in attempts to transit po's into esophagus.  No overt coughing or wet vocal quality during assessment.  He required therapeutic mild-moderate cues for smaller sips via cup and surprisingly appeared to take smaller sips with the straw.  Wife confirmed that his performance today is same as at home.  Recommend slight texture downgrade to Dys 3, continue thin, small sips with straws, multiple swallows, clear throat, pills crushed in applesauce and stay upright 45 minutes after meals.      Aspiration  Risk  Moderate    Diet Recommendation Dysphagia 3 (Mechanical Soft);Thin liquid   Liquid Administration via: Cup;Straw Medication Administration: Crushed with puree Supervision: Patient able to self feed;Full supervision/cueing for compensatory strategies Compensations: Slow rate;Small sips/bites;Multiple dry swallows after each bite/sip;Clear throat intermittently Postural Changes and/or Swallow Maneuvers: Seated upright 90 degrees;Upright 30-60 min after meal    Other  Recommendations Oral Care Recommendations: Oral care BID   Follow Up Recommendations   (TBD)    Frequency and Duration min 2x/week  2 weeks   Pertinent Vitals/Pain none    SLP Swallow Goals Patient will utilize recommended strategies during swallow to increase swallowing safety with: Minimal cueing   Swallow Study         Oral/Motor/Sensory Function Overall Oral Motor/Sensory Function: Appears within functional limits for tasks assessed   Ice Chips Ice chips: Impaired Presentation: Spoon Pharyngeal Phase Impairments: Throat Clearing - Delayed   Thin Liquid Thin Liquid: Impaired Presentation: Cup;Straw Pharyngeal  Phase Impairments: Multiple swallows;Throat Clearing - Delayed;Throat Clearing - Immediate    Nectar Thick Nectar Thick Liquid: Not tested   Honey Thick Honey Thick Liquid: Not tested   Puree Puree: Not tested   Solid   GO    Solid: Impaired Oral Phase Functional Implications:  (prolonged mastication) Pharyngeal Phase Impairments: Throat Clearing - Delayed;Multiple swallows;Throat Clearing - Immediate       Breck Coons Monike Bragdon M.Ed [employer] [phone]  01/28/2013

## 2013-01-28 NOTE — Significant Event (Signed)
Rapid Response Event Note  Overview: called for patient not breathing well in CT scan Time Called: 0906 Arrival Time: 0910 Event Type: Respiratory  Initial Focused Assessment:  Upon arrival to Ct scan patient in bed with nasal cannula on.  Patient is not responsive and RR 6 and shallow.  Patient on monitor HR 40's and weak, as per CT tech HR had been 70-80's, SPO2 80's   Interventions: Patient placed on zoll, 100% oxygen given via ambu bag, primary RN, Riley Lam from 4 N called and now at bedside,  Attending Dr. [name] called and updated.  Code Blue called.  Patient started to become responsive and placed on NRB, HR now in 70's SPO2 96% on NRB, RR 10.  Patient transported to 2114.  Dr Tyson Alias at bedside and updated .  Rn to call if assistance needed   Event Summary:   at      at          [place]

## 2013-01-28 NOTE — Progress Notes (Signed)
AT 0900 this morning patient was transported to radiology for CT scan, on arrival RN was called that patient seemed to be desating in the 80's so rapid response has been called on him. RN went down to see patient and a gave a brief history on patient. He was then transferred to 2100 due to patient declining.

## 2013-01-28 NOTE — Progress Notes (Signed)
PT Cancellation Note  Patient Details Name: [name] [name] MRN: [id number] DOB: [birth year]   Cancelled Treatment:    Reason Eval/Treat Not Completed: Medical issues which prohibited therapy;Other (comment) (see RR team note).  Pt transferred to ICU 2100 from 4N due to respiratory issues.  PT will check back on 01-29-13 to see if pt is medically more stable to be seen.    Thanks,   Rollene Rotunda. Cece Milhouse, PT, DPT [phone]   01/28/2013, 10:48 AM

## 2013-01-28 NOTE — Progress Notes (Signed)
UR Completed.  [name] [name] Jane [phone] 01/28/2013  

## 2013-01-28 NOTE — Progress Notes (Signed)
[facility] INPATIENT PROGRESS NOTE  Name: [name] [name]      MRN: [id number]    Location: 2112/2112-01  Date: 01/28/2013 Time:10:54 PM   Subjective: Interval History:[name] [name] had bradycardia, respiratory distress, hypoxia when he was down in radiology for CT chest this morning.  He recovered quickly and was brought back to ICU.  He complained this afternoon of upper back pain from recent surgery.  He received IV morphine with hypotension and was difficult to arouse.  He was given Narcan and recovered.  He right now reported bilateral lower extremity weakness and paresthesia.  He denied SOB, chest pain, abdominal pain, abdominal swelling, visible bleeding.   Objective: Vital signs in last 24 hours: Temp:  [97.1 F (36.2 C)-98.3 F (36.8 C)] 97.8 F (36.6 C) (06/12 1200) Pulse Rate:  [50-95] 75 (06/12 2230) Resp:  [5-18] 16 (06/12 2230) BP: (58-171)/(32-80) 67/41 mmHg (06/12 2230) SpO2:  [93 %-100 %] 97 % (06/12 2230)     PHYSICAL EXAM: Gen: Well-nourished man, in no acute distress. Eyes: No scleral icterus or jaundice. ENT: There was no oropharyngeal lesions. Neck was supple without thyromegaly. There was dry blood on the dressings from laminectomy.  The wound appeared slightly edematous without active bleeding, purulent discharge. Lymphatics: Negative for cervical, supraclavicular, axillary, or inguinal adenopathy.  Respiratory: Lungs were clear bilaterally without wheezing or crackles. Cardiovascular: normal heart rate and rhythm; S1/S2; without murmur, rubs, or gallop. There was no pedal edema. Left arm was swollen from shoulder to wrist without palpable cords.  GI: Abdomen was soft, flat, nontender, nondistended, without organomegaly. Skin exam was without ecchymosis, petechiae. Upper extremities was 4/5 bilaterally.  Lower extremities were 3/5 bilaterally.       MEDICATIONS: reviewed.     PROBLEM LIST:  1.  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  2.  Acute cord compression s/p laminectomy earlier with epidural hematoma post op; and now concerning for recurrent hematoma with now bilateral lower extremity paraplegia.  3.  Anemia from both chronic disease and now from occult blood loss.  4.  Hyponatremia.  5.  Hypertension. 6.  EtOH abuse. 7.  Hypothyroidism. 8.  BPH 9.  Depression, anxiety. 10.  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  11. Respiratory distress, transient.   Impression:   I discussed with patient and with his wife grave prognosis.  He has acute DVT that cannot be anticoagulated due to occult blood loss and epidural hematoma.  His disease is rapidly progressing specifically in the left thoracic outlet causing the DVT.  His respiratory status is tenuous.  He is only on nasal canula now; however, he has had two episodes today of respiratory depression.  I am concerned for a pulmonary embolism from this DVT that can not be treated.   His wife expressed informed understanding.  I recommended continuing current care with transfusion prn for Hgb <7.  However, no anticoagulation can be safely administered.  Dr. Lovell Sheehan from NeuroSurgery had discussed with patient's wife regarding his poor operative candidate for repeat surgical intervention for the 3rd time for his suspected epidural hematoma. She agreed with current short term plan with continue supportive care.  For long term, if he improves over the next few days, we can either discuss hospice placement if she wants comfort care only or placement at SNF if he is stronger for outpatient palliative radiation and chemo.  She had also decided to  make him DNR/DNI in case he has cardiopulmonary arrest.   PLAN:  - Continue Dexamethasone for cord compression. - D/C IV morphine.  Use only Percocet sparingly.  - D/C Lovenox SQ.  - Follow CBC and transfuse for Hgb <7.    DNR/DNI.

## 2013-01-28 NOTE — Progress Notes (Signed)
Spoke with Pt wife over phone shortly after the Pt admission to the unit and her conversation with Dr. Tyson Alias.  Wife Claris Che reported to me that she agreed with NOT placing the Pt on the ventilator, and NOT performing CPR or cardioversion if his heart were to stop.  She reported she wanted the medical team to continue to attempt to help at this time placing a focus on comfort if he were to continue to decompensate.   Jacqulyn Cane, RN

## 2013-01-28 NOTE — Progress Notes (Signed)
Wife called with update on patients declining condition. She is on her way.

## 2013-01-28 NOTE — Consult Note (Signed)
PULMONARY  / CRITICAL CARE MEDICINE  Name: [name] [name] MRN: [id number] DOB: 17-Mar-1956    ADMISSION DATE:  01/20/2013 CONSULTATION DATE:  01/28/2013  REFERRING MD :  [name] PRIMARY SERVICE: CCM  CHIEF COMPLAINT:  Acute respiratory failure  BRIEF PATIENT DESCRIPTION: 57 year old male with metastatic head and neck SCC, admitted on 6/4 for lower extremity weakness and hyponatremia.  Underwent thoracic laminectomy on 6/5.  Was moving toward discharge when experienced episode of unresponsiveness  SIGNIFICANT EVENTS / STUDIES:  6/5: T1-T3 laminectomy for debulking of a thoracic epidural tumor  6/6: Evacuation of thoracic epidural hematoma  6/10: Acute, nonocclusive DVT left upper extremity, lovenox started 6/12: Acute drop in Hbg, lovenox stopped 6/12: Unresponsive episode in CT, transferred to ICU  LINES / TUBES: R-chest port  CULTURES: MRSA PCR 6/5: positive   ANTIBIOTICS: None currently  HISTORY OF PRESENT ILLNESS:  Patient is a 57 year old male with known metastatic SCC, unknown primary, in head/neck region with metastases to lungs and spine.  He was initially admitted from oncology clinic for lower extremity weakness, hyponatremia, and anemia.  Hospital course included thoracic laminectomy for decompression followed by left upper extremity DVT.  Was started on lovenox and had subsequent acute drop in hemoglobin.  Was sent to CT by primary team for CTA to eval for both bleeding and PE.  While in CT, became unresponsive with limited breathing, bradycardia, and decreased saturation.  Code blue was called but no CPR was performed as the patient spontaneously recovered.  By the time he arrived in the ICU, his sats were in the upper 90s on non-re breather, breathing spontaneously, and talking.  I spoke with the patient's family by phone and confirmed that patient was now no code blue, no intubation, no cardioversion, but full medical support for now, comfort if fails  PAST MEDICAL HISTORY :   Past Medical History  Diagnosis Date  . Squamous cell carcinoma of head and neck 2011    Unknown primary. He presented with right sided ear pain. Biopsy in the neck nodes showed squamous cell carcinoma. He underwent neck dissection then chemoradiation. He developed lung nodules in August 2012. He received carboplatin, 5-FU, cetuximab which in October 2012 until may 2013 with multiple dose delays and reductions because of toxicities. He had disease progression in the lungs in may 2013.  Marland Kitchen Hearing loss     wears aides  . Hypothyroid   . Hypertension   . Gastroesophageal reflux disease     well controlled on omeprazole  . Obstructive sleep apnea     not using CPAP  . Anemia   . Dysphagia   . BPH (benign prostatic hyperplasia)   . Restlessness and agitation     unclear etiology.  His previous physicians gave him  Risperdal.   . Urge incontinence   . Alcohol abuse   . History of smoking   . Emphysema 2013    mild centrilobular per CT  . Lung metastases 5/22014  . 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 Hyponatremia 07/2012    h/o beer drinker's potomania with low ADH   Past Surgical History  Procedure Laterality Date  . Modified radical neck dissection  2011    left side.   . Thyroidectomy, partial  2011  . Lung biopsy    . Other surgical history  2008, 2009    L femur rod  . Other surgical history  2008    R radius  .  Thoracic spine surgery  01/2013    debluking of tumor  . Laminectomy N/A 01/21/2013    Procedure: THORACIC LAMINECTOMY FOR TUMOR;  Surgeon: Cristi Loron, MD;  Location: [facility];  Service: Neurosurgery;  Laterality: N/A;  . Wound exploration N/A 01/22/2013    Procedure: WOUND EXPLORATION;  Surgeon: Cristi Loron, MD;  Location: [facility];  Service: Neurosurgery;  Laterality: N/A;  Thoracic one -thoracic three   Prior to Admission medications   Medication Sig Start Date End Date Taking? Authorizing Provider  aspirin 81  MG tablet Take 81 mg by mouth every morning.    Yes Historical Provider, MD  Cholecalciferol (VITAMIN D3) 5000 UNITS TABS Take 2 tablets by mouth every evening.    Yes Historical Provider, MD  escitalopram (LEXAPRO) 20 MG tablet TAKE 1 TABLET BY MOUTH EVERY DAY 01/01/13  Yes Eustaquio Boyden, MD  levothyroxine (SYNTHROID, LEVOTHROID) 50 MCG tablet Take 50-100 mcg by mouth See admin instructions. 50 mcg daily with the exception of 2 days out the week patient takes 100 mcg (no specific day) 12/07/12  Yes Eustaquio Boyden, MD  losartan (COZAAR) 100 MG tablet  12/31/12  Yes Eustaquio Boyden, MD  magnesium gluconate (MAGONATE) 500 MG tablet Take 500 mg by mouth every evening.    Yes Historical Provider, MD  naproxen sodium (ANAPROX) 220 MG tablet Take 440 mg by mouth every morning.   Yes Historical Provider, MD  NON FORMULARY Take 30 mLs by mouth every morning. Flor-essence 2 TBSP   Yes Historical Provider, MD  omeprazole (PRILOSEC) 40 MG capsule Take 40 mg by mouth every morning. 10/20/12  Yes Eustaquio Boyden, MD  ondansetron (ZOFRAN) 8 MG tablet Take 8 mg by mouth every 8 (eight) hours as needed for nausea.    Yes Historical Provider, MD  prochlorperazine (COMPAZINE) 10 MG tablet Take 10 mg by mouth every 6 (six) hours as needed (nausea).    Yes Historical Provider, MD  risperiDONE (RISPERDAL) 0.25 MG tablet Take 0.25 mg by mouth every evening.   Yes Historical Provider, MD  tamsulosin (FLOMAX) 0.4 MG CAPS Take 0.4 mg by mouth every evening. 01/18/13  Yes Eustaquio Boyden, MD  Albuterol Sulfate (PROAIR HFA IN) Inhale 1 puff into the lungs every 6 (six) hours as needed (FOR SHORTNESS OF BREATH).     Historical Provider, MD  docusate sodium 100 MG CAPS Take 100 mg by mouth 2 (two) times daily. 01/26/13   Cristi Loron, MD  morphine (MS CONTIN) 15 MG 12 hr tablet Take 1 tablet (15 mg total) by mouth every 12 (twelve) hours. 01/26/13   Cristi Loron, MD  oxyCODONE-acetaminophen (PERCOCET/ROXICET) 5-325 MG  per tablet Take 1-2 tablets by mouth every 4 (four) hours as needed. 01/26/13   Cristi Loron, MD  tamsulosin (FLOMAX) 0.4 MG CAPS Take 1 capsule (0.4 mg total) by mouth daily. 01/26/13   Cristi Loron, MD   Allergies  Allergen Reactions  . Benadryl (Diphenhydramine Hcl) Anxiety    IV only-oral okay    FAMILY HISTORY:  Family History  Problem Relation Age of Onset  . Cancer Father     Melanoma  . Cancer Maternal Grandfather     laryngeal  . Hypertension Father   . CAD Neg Hx   . Stroke Neg Hx   . Diabetes Neg Hx    SOCIAL HISTORY:  reports that he quit smoking about 11 years ago. He quit smokeless tobacco use about 11 years ago. He reports that  drinks alcohol. He reports that he does not use illicit drugs.  REVIEW OF SYSTEMS:  Unable to perform due to patient mental status  SUBJECTIVE:   VITAL SIGNS: Temp:  [98 F (36.7 C)-98.3 F (36.8 C)] 98 F (36.7 C) (06/12 0533) Pulse Rate:  [66-80] 80 (06/12 0533) Resp:  [16-18] 18 (06/12 0533) BP: (133-170)/(65-76) 138/73 mmHg (06/12 0533) SpO2:  [97 %-100 %] 98 % (06/12 0533) HEMODYNAMICS:   VENTILATOR SETTINGS:   INTAKE / OUTPUT: Intake/Output     06/11 0701 - 06/12 0700 06/12 0701 - 06/13 0700   P.O. 1020    Total Intake(mL/kg) 1020 (11.4)    Urine (mL/kg/hr) 2275 (1.1)    Total Output 2275     Net -1255            PHYSICAL EXAMINATION: General:  NAD Neuro:  Alert and conversant, GCS 15, disoriented HEENT:  Radiation skin changes left neck, some well healed incisions Cardiovascular:  RRR Lungs:  Bilateral ronchi, coarse breath sounds throughout Abdomen:  SNTND Musculoskeletal:  Moves all extremities equally Skin:  No rashes, serosanguinous drainage from incision upper T-spine  LABS:  Recent Labs Lab 01/22/13 0425 01/22/13 1028 01/23/13 0500 01/25/13 0507 01/25/13 1800 01/28/13 0503  HGB 9.9*  --  9.9*  --  10.0* 6.2*  WBC 16.4*  --  10.7*  --  10.4 11.3*  PLT 258  --  238  --  225 195  NA  122*  --  123* 120* 121* 117*  K 4.8  --  4.8 4.7 4.5 4.8  CL 86*  --  89* 89* 87* 82*  CO2 26  --  24 27 29 31   GLUCOSE 146*  --  94 99 124* 93  BUN 18  --  27* 19 17 20   CREATININE 1.02  --  1.07 0.76 0.72 0.75  CALCIUM 9.2  --  8.7 8.9 8.3* 8.5  PHART  --  7.381  --   --   --   --   PCO2ART  --  41.9  --   --   --   --   PO2ART  --  172.0*  --   --   --   --    No results found for this basename: GLUCAP,  in the last 168 hours  CXR: Pending  ASSESSMENT / PLAN:  PULMONARY A: Acute respiratory failure Possible PE, highly likely P:   - cont O2 as needed - Unable to anticoagulate for PE as patient may have active bleeding, new hematoma back etc -Lasix consideration -No options filter for upper ext dvt -limited options -treat anemia -lower O2 support if able, consider NOT escalating back to 100% -iff rr rises, comfort care per wife  CARDIOVASCULAR A: h/o HTN, brady episode, likely pe P:  - hold cozaar if becomes hypotensive -unable to anticoagulate -tele -prbc  RENAL A:  H/o BPH, hyponatremia P:   - cont flomax -lasix consideration, chem, in am  -on saline Na worsen - rises suspicion SIADH, prior osm noted, repeat urine osm   GASTROINTESTINAL A:  Reflux, r/o  dysphagia P:   - Colace for chronic narcotics - Protonix -SLP  HEMATOLOGIC A:  Anemia (acute blood loss), Metestatic SCC, r/o retroper bleed, likely new thoracic hematoma P:  -Dc all lovenox - Transfuse 1 units now, then cbc to follow - Cont dexamethasone for cord compression -may require abdo CT -avoid ffp  / protamine with high such high clot burden if able -doppler legs,  if pos , consider filter  INFECTIOUS A:  No evidence of infection P:  monitor  ENDOCRINE A:  Hyponatremia (low serum osmolality, low urine osmolality, low urine Na)-last on 6/4 Hypothyroid Edema  P:   - Cont current dose synthroid - lasix - KVO -Etoh na would be resposnive to saline, this is not -Chem in am    NEUROLOGIC A:  Cord compression, depression, anxiety, pain secondary to mets P:   - Cont dexamethasone per neurosurgery - Continue lexepro, risperdal - Cont ms contin -if distress, morphine drip  TODAY'S SUMMARY: 57 yo M with metastatic SCC of head and neck transferred to ICU for acute resp failure likely secondary to anemia and likely PE.  At this time, patient is no-code and observation/medical support only. D/w wife. comfort if fails  I have personally obtained a history, examined the patient, evaluated laboratory and imaging results, formulated the assessment and plan and placed orders. CRITICAL CARE: The patient is critically ill with multiple organ systems failure and requires high complexity decision making for assessment and support, frequent evaluation and titration of therapies, application of advanced monitoring technologies and extensive interpretation of multiple databases. Critical Care Time devoted to patient care services described in this note is 85 minutes.   Mcarthur Rossetti. Tyson Alias, MD, FACP Pgr: [phone] [facility]  Pulmonary and Critical Care Medicine [facility] Pager: [phone]  01/28/2013, 9:40 AM

## 2013-01-28 NOTE — Progress Notes (Signed)
Updated by Charge RN of patient's decline and transfer to ICU- updated [employer] CM as well as [facility] SN[facility] of this-  Will update CSW covering 2100 to plans and above-  Reece Levy, MSW, [employer] [phone]

## 2013-01-28 NOTE — Progress Notes (Signed)
Patient ID: [name] [name], male   DOB: [birth year], 57 y.o.   MRN: [id number] Subjective:  The patient is alert and pleasant. He is a bit short of breath.  Objective: Vital signs in last 24 hours: Temp:  [97.1 F (36.2 C)-98.3 F (36.8 C)] 98 F (36.7 C) (06/12 1115) Pulse Rate:  [71-80] 79 (06/12 1115) Resp:  [13-18] 15 (06/12 1115) BP: (107-170)/(48-76) 143/71 mmHg (06/12 1115) SpO2:  [98 %-100 %] 100 % (06/12 1100)  Intake/Output from previous day: 06/11 0701 - 06/12 0700 In: 1020 [P.O.:1020] Out: 2275 [Urine:2275] Intake/Output this shift: Total I/O In: 600 [I.V.:250; Blood:350] Out: -   Physical exam the patient is alert and oriented x2, person in a hospital. The patient is paraplegic, he maintains sensation in his legs.  Lab Results:  Recent Labs  01/25/13 1800 01/28/13 0503  WBC 10.4 11.3*  HGB 10.0* 6.2*  HCT 29.7* 18.0*  PLT 225 195   BMET  Recent Labs  01/25/13 1800 01/28/13 0503  NA 121* 117*  K 4.5 4.8  CL 87* 82*  CO2 29 31  GLUCOSE 124* 93  BUN 17 20  CREATININE 0.72 0.75  CALCIUM 8.3* 8.5    Studies/Results: Dg Chest Port 1 View  01/28/2013   *RADIOLOGY REPORT*  Clinical Data: Hypoxia, decreased mental status.  PORTABLE CHEST - 1 VIEW  Comparison: 01/23/2013  Findings: The patient is rotated.  Right IJ Port-A-Cath tip projects over the SVC.  Radiopaque defibrillator pad projects over the lower left chest.  Heart size normal.  Lungs are low in volume with possible air space disease in the left lower lobe.  No definite pleural fluid. Postoperative or post-traumatic changes involve the distal left clavicle.  IMPRESSION: Question residual air space disease in the left lower lobe.   Original Report Authenticated By: Leanna Battles, M.D.    Assessment/Plan: Respiratory distress: This could be secondary to ulnar embolism or possibly 3 weakness secondary to his paraplegia. The patient has been made a DO NOT RESUSCITATE given the extent of his  cancer.  Paraplegia: I discussed this with the patient and his wife. I suspect he has another epidural hematoma. We discussed the treatment options. Unfortunately he is "between a rock and a hard place", with his upper strumming deep venous thrombosis and need to be anticoagulated and with a presumed epidural hematoma. They declined surgery. This seems appropriate.  LOS: 8 days     Aliannah Holstrom D 01/28/2013, 11:40 AM

## 2013-01-28 NOTE — Progress Notes (Signed)
Chaplain Note:  Chaplain visited with pt an pt's wife. Pt was in bed, somnolent but roused when spoken to directly.  Pt's wife was at bedside in support of pt.  Pt's prognosis is grim.  Pt's wife is struggling with anticipatory grief.  She leans heavily on faith.  Chaplain provided spiritual comfort, support, and prayer for pt and pt's wife.  Pt's wife expressed appreciation for chaplain support.  Chaplain will follow up as needed.  01/28/13 1200  Clinical Encounter Type  Visited With Patient and family together  Visit Type Spiritual support  Referral From Nurse  Spiritual Encounters  Spiritual Needs Prayer;Emotional  Stress Factors  Patient Stress Factors Health changes  Family Stress Factors Major life changes;Other (Comment) (Pt's wife dealing with pt's very poor prognosis.)  Verdie Shire, [address] [phone]

## 2013-01-28 NOTE — Progress Notes (Signed)
CRITICAL VALUE ALERT  Critical value received:  Hemoglobin - 6.2; Sodium - 117  Date of notification:  01/28/2013  Time of notification:  0630  Critical value read back:yes  Nurse who received alert:  Elie Goody, RN  MD notified (1st page):  Dr. Markham Jordan  Time of first page:  0630  MD notified (2nd page):  Time of second page:  Responding MD:  Dr. Markham Jordan  Time MD responded:  [phone]

## 2013-01-29 DIAGNOSIS — E43 Unspecified severe protein-calorie malnutrition: Secondary | ICD-10-CM

## 2013-01-29 LAB — URINE CULTURE: Culture: NO GROWTH

## 2013-01-29 LAB — BASIC METABOLIC PANEL
CO2: 31 mEq/L (ref 19–32)
Calcium: 8.6 mg/dL (ref 8.4–10.5)
Chloride: 82 mEq/L — ABNORMAL LOW (ref 96–112)
Glucose, Bld: 91 mg/dL (ref 70–99)
Potassium: 4.5 mEq/L (ref 3.5–5.1)
Sodium: 118 mEq/L — CL (ref 135–145)

## 2013-01-29 LAB — CBC
Hemoglobin: 7.7 g/dL — ABNORMAL LOW (ref 13.0–17.0)
Platelets: 183 10*3/uL (ref 150–400)
RBC: 2.57 MIL/uL — ABNORMAL LOW (ref 4.22–5.81)
WBC: 16.7 10*3/uL — ABNORMAL HIGH (ref 4.0–10.5)

## 2013-01-29 LAB — TYPE AND SCREEN
ABO/RH(D): A POS
Antibody Screen: NEGATIVE
Unit division: 0

## 2013-01-29 NOTE — Progress Notes (Signed)
Speech Language Pathology Dysphagia Treatment Patient Details Name: [name] [name] MRN: [id number] DOB: 05-Aug-1956 Today's Date: 01/29/2013 Time: 1345-     Assessment / Plan / Recommendation Clinical Impression  SLP arrived for dysphagia treatment as pt. starting to eat lunch.  He needed min-moderate verbal cues to take small bites and sips.  He produced multiple swallows and cleared throat most likley due to compensatory strategy he initiated prior to thin admission.  Volitional cough ineffective.  Pt. began to gag with constant throat clears and cued to stop, take break.  Asked pt. to tuck chin with hard effortful volitional swallw in attempts to clear pharynx.  Pt. needs full supervision with meals.  Continue to follow for additional education.    Diet Recommendation  Continue with Current Diet: Dysphagia 3 (mechanical soft);Thin liquid    SLP Plan Continue with current plan of care   Pertinent Vitals/Pain Back, RN aware   Swallowing Goals  SLP Swallowing Goals Patient will utilize recommended strategies during swallow to increase swallowing safety with: Minimal cueing Swallow Study Goal #2 - Progress: Progressing toward goal  General Temperature Spikes Noted: No Respiratory Status: Supplemental O2 delivered via (comment) Behavior/Cognition: Alert;Cooperative;Requires cueing Oral Cavity - Dentition: Adequate natural dentition Patient Positioning: Upright in bed  Oral Cavity - Oral Hygiene Does patient have any of the following "at risk" factors?: Oxygen therapy - cannula, mask, simple oxygen devices;Nutritional status - inadequate Brush patient's teeth BID with toothbrush (using toothpaste with fluoride): Yes Patient is HIGH RISK - Oral Care Protocol followed (see row info): Yes Patient is AT RISK - Oral Care Protocol followed (see row info): Yes   Dysphagia Treatment Treatment focused on: Skilled observation of diet tolerance Treatment Methods/Modalities: Skilled  observation Patient observed directly with PO's: Yes Type of PO's observed: Dysphagia 3 (soft);Thin liquids Feeding: Able to feed self Liquids provided via: Straw Type of cueing: Verbal Amount of cueing: Minimal   GO     Royce Macadamia M.Ed [employer] [phone]  01/29/2013

## 2013-01-29 NOTE — Progress Notes (Signed)
Clinical Dietitian from [employer] from [employer] [facility] [place] inquiring additional clinical information regarding pt.  CSW staffed case with MD and returned voicemail with Boneta Lucks at [phone]).    Angelia Mould, MSW, [place] [phone]

## 2013-01-29 NOTE — Progress Notes (Signed)
PT Cancellation Note  Patient Details Name: [name] [name] MRN: [id number] DOB: 02/27/1956   Cancelled Treatment:    Reason Eval/Treat Not Completed: Other (comment) (Patient is transferring to residential Hospice per CM/SW.  )Nursing agreed to [facility] PT secondary to transfer.     [name],[name] Brester 01/29/2013, 12:52 PM  [name] [name] [name] [phone] (pager)

## 2013-01-29 NOTE — Progress Notes (Signed)
Clinical Child psychotherapist met with pt's wife and MD.  Wife interested in Residential Hospice in [place].  CSW made referral.  Gavin Pound with [facility] currently reviewing information.    Angelia Mould, MSW, [place] [phone]

## 2013-01-29 NOTE — Progress Notes (Signed)
Clinical Social Worker spoke with [facility] [phone]); pt able to transfer to [facility] Saturday morning.  DC packet assembled and in pt's chart.  Phone number for report to facility for Saturday dc: [phone].  Wife, MD Tyson Alias, MD [name], and RN aware.    Angelia Mould, MSW, [place] [phone]

## 2013-01-29 NOTE — Progress Notes (Signed)
Patient ID: [name] [name], male   DOB: 1955-10-20, 56 y.o.   MRN: [id number] Subjective:  The patient is alert and pleasant. He is at times mildly confused.  Objective: Vital signs in last 24 hours: Temp:  [97.1 F (36.2 C)-98.7 F (37.1 C)] 97.8 F (36.6 C) (06/13 0330) Pulse Rate:  [50-95] 90 (06/13 0600) Resp:  [5-18] 13 (06/13 0600) BP: (58-197)/(32-80) 164/79 mmHg (06/13 0600) SpO2:  [85 %-100 %] 92 % (06/13 0600) Weight:  [90.5 kg (199 lb 8.3 oz)] 90.5 kg (199 lb 8.3 oz) (06/13 0500)  Intake/Output from previous day: 06/12 0701 - 06/13 0700 In: 1482.5 [P.O.:400; I.V.:420; Blood:662.5] Out: 2240 [Urine:2240] Intake/Output this shift:    Physical exam the patient is alert and oriented. He is paraplegic. He has some swelling of his wound.  Lab Results:  Recent Labs  01/28/13 2220 01/29/13 0446  WBC 10.5 16.7*  HGB 6.1* 7.7*  HCT 17.4* 21.6*  PLT 175 183   BMET  Recent Labs  01/28/13 0503 01/29/13 0446  NA 117* 118*  K 4.8 4.5  CL 82* 82*  CO2 31 31  GLUCOSE 93 91  BUN 20 32*  CREATININE 0.75 0.99  CALCIUM 8.5 8.6    Studies/Results: Dg Chest Port 1 View  01/28/2013   *RADIOLOGY REPORT*  Clinical Data: Hypoxia, decreased mental status.  PORTABLE CHEST - 1 VIEW  Comparison: 01/23/2013  Findings: The patient is rotated.  Right IJ Port-A-Cath tip projects over the SVC.  Radiopaque defibrillator pad projects over the lower left chest.  Heart size normal.  Lungs are low in volume with possible air space disease in the left lower lobe.  No definite pleural fluid. Postoperative or post-traumatic changes involve the distal left clavicle.  IMPRESSION: Question residual air space disease in the left lower lobe.   Original Report Authenticated By: Leanna Battles, M.D.    Assessment/Plan: Thoracic metastasis, epidural hematoma: The patient has been made a DO NOT RESUSCITATE. He needs to be on blood thinners because of his upper extremity DVTs. He is not a surgical  candidate.  I discussed situation with the patient. He has requested transfer to hospice in [place].  LOS: 9 days     Makenze Ellett D 01/29/2013, 7:16 AM

## 2013-01-29 NOTE — Discharge Summary (Signed)
Physician Discharge Summary   Patient ID: [name] [name] [id number] 57 y.o. 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   Hospital Course:   1.  Cord compression:  He was admitted, placed on Decadron.  MRI showed cord compression.  He underwent on 01/21/2013 thoracic laminectomy for debulking of the epidural tumor and decompression of the spinal cord.  He was noted to have right leg paralysis was was found to have thoracic epidural hematoma for which he underwent evacuation on 01/22/2013.  He was evaluated by PT with recommendation for SNF placement.   2.  Bilateral upper extremity DVT (left more than right).  On 01/26/13, he was found to have left arm edema.  Doppler US showed extensive bilateral UE DVT.  He was placed on Lovenox 90mg  SQ BID.    The next morning on 01/27/13, he was found to have severe anemia.  There was concern for hematoma.  Therefore, Lovenox was discontinued.  He was sent for CT to document his hematoma. In radiology, he was found to be difficult to around, respiratory depression, hypoxia.  He was transferred to ICU.  He recovered quickly and did not require pressor or mechanical ventilation.   He then developed left leg paralysis in addition to the right leg.  Neurosurgery had nigh  suspicion for extension of epidural hematoma.  He could have radiation to the cord compression; however, he was not a candidate for resection of this rapidly progressing left upper lobe mass and not candidate for further radiation as it was irradiated before.  However, given his poor prognosis from his co morbidities and competing diagnosis of DVT, his wife decided to make him comfort care and not to pursue further therapy.    I also had a family meeting with the patient, his wife, his parents, his two siblings on 01/29/13.   They understood that he has stage IV incurable cancer.  Given his co-morbidities, EtOH abuse, poor performance status, malnutrition, he was not a good candidate for palliative, salvage chemotherapy for metastatic head and neck cancer.  They all agreed with DNR/DNI code status and hospice placement.   Consults:  Neurosurgery Dr. Tressie Stalker PCCM Radiation oncology Dr. Lonie Peak   Significant Diagnostic Studies:  1.  01/20/13:  MRI thoracic spine:  Findings: Counting was performed from the craniocervical junction.  There is metastatic infiltration of T1-T3 which is extensive. No  pathologic compression fracture. Loss of the subarachnoid space at  these levels with abnormal enhancing soft tissue around the thecal  sac and cord anteriorly and posteriorly compatible with  extraosseous extension of tumor. Complete effacement of the  subarachnoid space with transverse and AP compression of the  thoracic cord. Extension into both neural foramina is present at  T2-T3 and into the right neural foramen at T3-T4 and T1-T2.  Tumor extends into the paravertebral fat. Pulmonary metastatic  nodule in the posterior right upper lobe is again noted. No  convincing evidence of drop metastases in the lower thoracic spine.  There is diffuse infiltration of the posterior elements from T1-T3.  The bony metastatic disease also involves T4 and T5 although to a  lesser extent and T1-T3.  Heterogeneous marrow signal is present  throughout, likely associated with chronic disease. Mid thoracic  compression fractures are present which appear chronic, without  marrow edema. Minimal thoracic spine degenerative disease is  present with a few shallow disc bulges, most pronounced at T9-T10.  Left interpolar renal cyst incidentally noted.  IMPRESSION:  Thoracic cord compression due to extraosseous extension of bony  metastatic disease from T1 through T3. This encircles the thecal  sac and produces severe stenosis with compression of the thoracic  cord and complete effacement of the subarachnoid space. Right  greater than left foraminal invasion by tumor. Bony metastatic  disease is evident from T1-T5. There may be another small focus in  the dorsal aspect of T7.    2.  01/26/13:  Upper extremity Venous Doppler US.    Findings consistent with acute deep vein thrombosis involving the right brachial vein, left axillary vein, left brachial vein, left radial vein, and left ulnar vein, as well as subacuteocclusive deep vein thrombosis of the left subclavian vein.There is also evidence of superficial vein thrombosis involving bilateral basilic veins.    Treatments: thoracic laminectomy on 01/21/13; evacuation of epidural hematoma 01/22/13;  blood transfusion;   Discharge Exam:  Gen: no acute distress. Eyes: No scleral icterus or jaundice. ENT: There was no oropharyngeal lesions. Neck was supple without thyromegaly. Lymphatics: Negative for cervical, supraclavicular, axillary, or inguinal adenopathy. Respiratory: Lungs were clear bilaterally without wheezing or crackles. Cardiovascular: normal heart rate and rhythm; S1/S2; without murmur, rubs, or gallop. There was no pedal edema. Left arm was swollen from shoulder to wrist without palpable cords. GI: Abdomen was soft, flat, nontender, nondistended, without organomegaly. Skin exam was without ecchymosis, petechiae. Motor strength in  bilateral upper extremities were 4/5; lower extremities were 3/5.  Patient was alert. Attention was good. Language was appropriate. Mood was normal without depression. Speech was not pressured. Thought content was not tangential.    [facility]:  [facility]   Patient Instructions:    Medication List    STOP taking these medications       aspirin 81 MG tablet     escitalopram 20 MG tablet  Commonly known as:  LEXAPRO     losartan 100 MG tablet  Commonly known as:  COZAAR     magnesium gluconate 500 MG tablet  Commonly known as:  MAGONATE     naproxen sodium 220 MG tablet  Commonly known as:  ANAPROX     tamsulosin 0.4 MG Caps  Commonly known as:  FLOMAX     Vitamin D3 5000 UNITS Tabs      TAKE these medications       DSS 100 MG Caps  Take 100 mg by mouth 2 (two) times daily.     levothyroxine 50 MCG tablet  Commonly known as:  SYNTHROID, LEVOTHROID  Take 50-100 mcg by mouth See admin instructions. 50 mcg daily with the exception of 2 days out the week patient takes 100 mcg (no specific day)     NON FORMULARY  Take 30 mLs by mouth every morning. Flor-essence 2 TBSP     omeprazole 40 MG capsule  Commonly known as:  PRILOSEC  Take 40 mg by mouth every morning.     ondansetron 8 MG  tablet  Commonly known as:  ZOFRAN  Take 8 mg by mouth every 8 (eight) hours as needed for nausea.     oxyCODONE-acetaminophen 5-325 MG per tablet  Commonly known as:  PERCOCET/ROXICET  Take 1-2 tablets by mouth every 4 (four) hours as needed.     PROAIR HFA IN  Inhale 1 puff into the lungs every 6 (six) hours as needed (FOR SHORTNESS OF BREATH).     prochlorperazine 10 MG tablet  Commonly known as:  COMPAZINE  Take 10 mg by mouth every 6 (six) hours as needed (nausea).     risperiDONE 0.25 MG tablet  Commonly known as:  RISPERDAL  Take 0.25 mg by mouth every evening.       Activity: activity as tolerated Diet: regular diet Wound Care: keep wound clean and  dry  Follow-up:  Discharged to [facility] with goal for comfort care only.  If his condition improves in the next few weeks, and his wife would like to, he can be referred [facility] for follow up.   SignedJethro Bolus 01/29/2013 11:13 PM

## 2013-01-29 NOTE — Consult Note (Signed)
PULMONARY  / CRITICAL CARE MEDICINE  Name: Ulas Zuercher MRN: [id number] DOB: December 15, 1955    ADMISSION DATE:  01/20/2013 CONSULTATION DATE:  01/28/2013  REFERRING MD :  [name] PRIMARY SERVICE: CCM  CHIEF COMPLAINT:  Acute respiratory failure  BRIEF PATIENT DESCRIPTION: 57 year old male with metastatic head and neck SCC, admitted on 6/4 for lower extremity weakness and hyponatremia.  Underwent thoracic laminectomy on 6/5.  Was moving toward discharge when experienced episode of unresponsiveness  SIGNIFICANT EVENTS / STUDIES:  6/5: T1-T3 laminectomy for debulking of a thoracic epidural tumor  6/6: Evacuation of thoracic epidural hematoma  6/10: Acute, nonocclusive DVT left upper extremity, lovenox started 6/12: Acute drop in Hbg, lovenox stopped 6/12: Unresponsive episode in CT, transferred to ICU 6/13- improved hemodynamics, still int change in MS  LINES / TUBES: R-chest port  CULTURES: MRSA PCR 6/5: positive   ANTIBIOTICS: None currently  SUBJECTIVE: int change in MS  VITAL SIGNS: Temp:  [97.1 F (36.2 C)-98.7 F (37.1 C)] 97.8 F (36.6 C) (06/13 0330) Pulse Rate:  [50-95] 75 (06/13 0800) Resp:  [5-20] 16 (06/13 0800) BP: (58-197)/(32-80) 160/64 mmHg (06/13 0800) SpO2:  [85 %-100 %] 95 % (06/13 0800) Weight:  [199 lb 8.3 oz (90.5 kg)] 199 lb 8.3 oz (90.5 kg) (06/13 0500) HEMODYNAMICS:   VENTILATOR SETTINGS:   INTAKE / OUTPUT: Intake/Output     06/12 0701 - 06/13 0700 06/13 0701 - 06/14 0700   P.O. 400    I.V. (mL/kg) 420 (4.6)    Blood 662.5    Total Intake(mL/kg) 1482.5 (16.4)    Urine (mL/kg/hr) 2240 (1) 75 (0.4)   Total Output 2240 75   Net -757.5 -75          PHYSICAL EXAMINATION: General:  NAD Neuro:  Alert this am , no movement legs, no sensation HEENT:  Radiation skin changes left neck, some well healed incisions Cardiovascular:  RRR s1 s 2 Lungs:  coarse Abdomen:  SNTND Musculoskeletal:  Moves all extremities equally Skin:  No rashes,  serosanguinous drainage from incision upper T-spine  LABS:  Recent Labs Lab 01/22/13 1028  01/25/13 1800 01/28/13 0503 01/28/13 1241 01/28/13 2220 01/29/13 0446  HGB  --   < > 10.0* 6.2* 6.5* 6.1* 7.7*  WBC  --   < > 10.4 11.3* 12.8* 10.5 16.7*  PLT  --   < > 225 195 166 175 183  NA  --   < > 121* 117*  --   --  118*  K  --   < > 4.5 4.8  --   --  4.5  CL  --   < > 87* 82*  --   --  82*  CO2  --   < > 29 31  --   --  31  GLUCOSE  --   < > 124* 93  --   --  91  BUN  --   < > 17 20  --   --  32*  CREATININE  --   < > 0.72 0.75  --   --  0.99  CALCIUM  --   < > 8.3* 8.5  --   --  8.6  PHART 7.381  --   --   --   --   --   --   PCO2ART 41.9  --   --   --   --   --   --   PO2ART 172.0*  --   --   --   --   --   --   < > =  values in this interval not displayed. No results found for this basename: GLUCAP,  in the last 168 hours  CXR:   ASSESSMENT / PLAN:  PULMONARY A: Acute respiratory failure Possible PE, highly likely P:   -DNI -would not escalate O2 -comfort full if distress  CARDIOVASCULAR A: h/o HTN, brady episode, likely pe Likely narc affect on HR P:  -avoiding morphine for now, agree with onc -consider tele dc  RENAL A:  H/o BPH, hyponatremia Repeat urine osm (pre lasix) - c/w SIADH P:   - cont flomax -lasix if continue medical support -fluid restiction  GASTROINTESTINAL A:  Reflux, r/o  dysphagia P:   - Colace for chronic narcotics - Protonix -doet started by SLP  HEMATOLOGIC A:  Anemia (acute blood loss), Metestatic SCC, r/o retroper bleed, likely new thoracic hematoma P:  -unable to use anticoagulation with drop hgb unclear -consider hospice -limited options  INFECTIOUS A:  No evidence of infection P:  monitor  ENDOCRINE A:  Hypothyroid Edema  P:   -see renal Diet, follow glu  NEUROLOGIC A:  Cord compression, depression, anxiety, pain secondary to mets P:   - Cont dexamethasone per neurosurgery - Continue lexepro, risperdal -  Cont pain management per onc -if distress, morphine drip  TODAY'S SUMMARY:plan hospice, NCB, appears comfortable, will sign off call if needed tx to floor if no hospice available  Mcarthur Rossetti. Tyson Alias, MD, FACP Pgr: [phone] [facility]  Pulmonary and Critical Care Medicine [facility] Pager: [phone]  01/29/2013, 9:00 AM

## 2013-01-29 NOTE — Progress Notes (Signed)
2200:  Pt mental status improved, now drowsy with normal respiratory pattern, family at bedside, VS WNL  0000: pt alert and following commands

## 2013-01-29 NOTE — Progress Notes (Signed)
CRITICAL VALUE ALERT  Critical value received: sodium of 118  Date of notification:  01-29-13  Time of notification: 0528  Critical value read back:yes  Nurse who received alert:  Jamesetta So RN  MD notified (1st page):  Pola Corn MD  Time of first page:  0530  Responding MD:  Vassie Loll  Time MD responded:  0530

## 2013-01-30 ENCOUNTER — Encounter (HOSPITAL_COMMUNITY): Payer: Self-pay | Admitting: Oncology

## 2013-01-30 DIAGNOSIS — I82409 Acute embolism and thrombosis of unspecified deep veins of unspecified lower extremity: Secondary | ICD-10-CM

## 2013-01-30 DIAGNOSIS — I621 Nontraumatic extradural hemorrhage: Secondary | ICD-10-CM

## 2013-01-30 DIAGNOSIS — I82629 Acute embolism and thrombosis of deep veins of unspecified upper extremity: Secondary | ICD-10-CM

## 2013-01-30 DIAGNOSIS — D638 Anemia in other chronic diseases classified elsewhere: Secondary | ICD-10-CM

## 2013-01-30 DIAGNOSIS — G9520 Unspecified cord compression: Secondary | ICD-10-CM | POA: Diagnosis present

## 2013-01-30 HISTORY — DX: Acute embolism and thrombosis of deep veins of unspecified upper extremity: I82.629

## 2013-01-30 HISTORY — DX: Unspecified cord compression: G95.20

## 2013-01-30 HISTORY — DX: Nontraumatic extradural hemorrhage: I62.1

## 2013-01-30 MED ORDER — FUROSEMIDE 20 MG PO TABS
20.0000 mg | ORAL_TABLET | Freq: Two times a day (BID) | ORAL | Status: DC
  Administered 2013-01-30: 20 mg via ORAL
  Filled 2013-01-30 (×3): qty 1

## 2013-01-30 MED ORDER — HEPARIN SOD (PORK) LOCK FLUSH 100 UNIT/ML IV SOLN
500.0000 [IU] | INTRAVENOUS | Status: AC | PRN
  Administered 2013-01-30: 500 [IU]

## 2013-01-30 NOTE — Progress Notes (Signed)
[facility] has confirmed bed for patient today- faxed d/c summary to hospice- wife aware and agreeable to plans- EMS to transport-   Reece Levy, MSW, LCSWA [phone]/weekend coverage

## 2013-01-30 NOTE — Progress Notes (Signed)
Report given to Joe at [facility].  All questions answered.  Gold DNR form is signed and in packet for discharge along with narcotic prescription.  IV team has been notified of discharge.  When port-a-cath is de-accessed, this RN will call transport.

## 2013-01-30 NOTE — Progress Notes (Signed)
Progress Note:  Subjective: No acute change overnight    Vitals: Filed Vitals:   01/30/13 0847  BP: 143/73  Pulse: 91  Temp: 98.4 F (36.9 C)  Resp: 18   Wt Readings from Last 3 Encounters:  01/29/13 198 lb 10.2 oz (90.1 kg)  01/29/13 198 lb 10.2 oz (90.1 kg)  01/29/13 198 lb 10.2 oz (90.1 kg)     PHYSICAL EXAM:  General NAD Head:WNL Eyes: Throat:no exudate Neck: Lymph Nodes: Lungs:diffuse, bilateral, coarse, rhonchi Breasts:  Cardiac:regular rhythm Abdominal: soft, non tender Extremities:no edema, no calf tenderness lower extremities; 2+ edema LUE S/P DVT Vascular:  No cyanosis Neurologic:  Paraplegic lower extremities, neurogenic bladder - foley catheter,  Absent sensation; DTRs remain intact at knees. Skin: pale  Labs:   Recent Labs  01/28/13 2220 01/29/13 0446  WBC 10.5 16.7*  HGB 6.1* 7.7*  HCT 17.4* 21.6*  PLT 175 183    Recent Labs  01/28/13 0503 01/29/13 0446  NA 117* 118*  K 4.8 4.5  CL 82* 82*  CO2 31 31  GLUCOSE 93 91  BUN 20 32*  CREATININE 0.75 0.99  CALCIUM 8.5 8.6      Images Studies/Results:   No results found.   Patient Active Problem List   Diagnosis Date Noted  . Acute respiratory failure 01/23/2013  . 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 Anemia, unspecified 01/20/2013  . Lung metastases 01/06/2013  . Rhomboid muscle strain 12/03/2012  . Urge incontinence   . Hyponatremia 07/07/2012  . Hypothyroid   . Hypertension   . Gastroesophageal reflux disease   . Obstructive sleep apnea   . BPH (benign prostatic hyperplasia)   . Alcohol abuse   . History of smoking   . Hearing loss   . Squamous cell carcinoma of head and neck   . Dysphagia     Assessment and Plan:   1. 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  2. Acute cord compression s/p laminectomy  with epidural hematoma post op requiring emergent  drainage 3.  recurrent hematoma with now bilateral lower extremity paraplegia.  4. Anemia from both chronic disease and now from occult blood loss. Transfused prior to discharge 4. Hyponatremia.  5. Hypertension.  6. EtOH abuse.  7. Hypothyroidism.  8. BPH  9. Depression, anxiety.  10. 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 Anticoagulation stopped due to perispinal hemorrhage. 11. Respiratory distress, transient. 12. Do Not Resuscitate per patient/family wishes  Discharge summary dictated by Dr [name].    [name] [name] M 01/30/2013, 12:36 PM

## 2013-02-01 ENCOUNTER — Encounter: Payer: Self-pay | Admitting: Family Medicine

## 2013-02-05 ENCOUNTER — Telehealth: Payer: Self-pay | Admitting: Family Medicine

## 2013-02-05 ENCOUNTER — Telehealth: Payer: Self-pay | Admitting: Oncology

## 2013-02-05 NOTE — Telephone Encounter (Signed)
Confidential Office Message [address] [place], [place] [address] p. [phone] f. [phone] To: [facility] (After Hours Triage) Fax: [phone] From: [facility] Date/ Time: 2013-02-08 6:56 PM Taken By: Reita Cliche, CSR Caller: Inetta Fermo Facility[facility] [place] Patient: [name] [name], [name] [name] DOB: 09/24/1955 Phone: [phone] Reason for Call: Inetta Fermo is calling from [facility] [place] to advise patient passed today 02-08-13. Regarding Appointment: Appt Date: Appt Time: Unknown Provider: Reason: Details: Outcome: Confidential

## 2013-02-07 NOTE — Telephone Encounter (Signed)
Noted. Called today and expressed my condolences.

## 2013-02-15 NOTE — Progress Notes (Signed)
Read note and agree with change in D/C recs Aryani Daffern B. Wray Goehring, PT, DPT [phone]

## 2013-02-16 DEATH — deceased

## 2013-04-05 ENCOUNTER — Ambulatory Visit (HOSPITAL_COMMUNITY): Payer: Federal, State, Local not specified - PPO

## 2013-04-05 ENCOUNTER — Other Ambulatory Visit: Payer: Federal, State, Local not specified - PPO | Admitting: Lab

## 2013-04-07 ENCOUNTER — Ambulatory Visit: Payer: Federal, State, Local not specified - PPO | Admitting: Oncology

## 2013-06-07 ENCOUNTER — Ambulatory Visit: Payer: Federal, State, Local not specified - PPO | Admitting: Family Medicine

## 2014-11-05 IMAGING — CT CT CHEST W/ CM
1 series · 15 of 33 positions shown, 19 images · IV contrast (OMNIPAQUE)
Comparison: 07/07/12

CLINICAL DATA: Metastatic head neck cancer

CT CHEST WITH CONTRAST
TECHNIQUE: Multidetector CT imaging of the chest was performed
following the standard protocol during bolus administration of
intravenous contrast.
Contrast: 80mL OMNIPAQUE IOHEXOL 300 MG/ML  SOLN

[Series 2: rtn chest with st · axial · 0.80mm/px · z∈[+988,+1252]mm · 15 of 63 slices shown, 19 images]
[im 5/63  mediastinal]
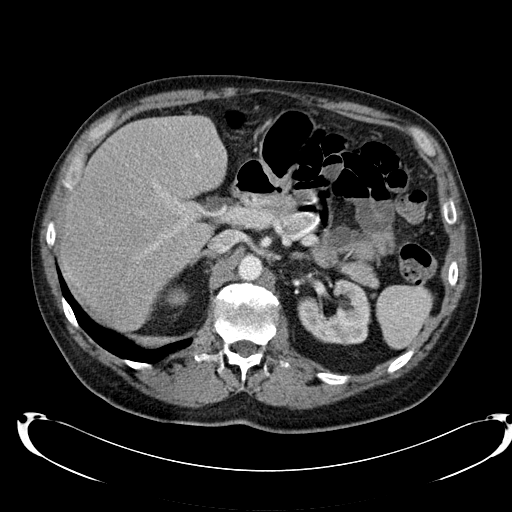
[im 5/63  lung]
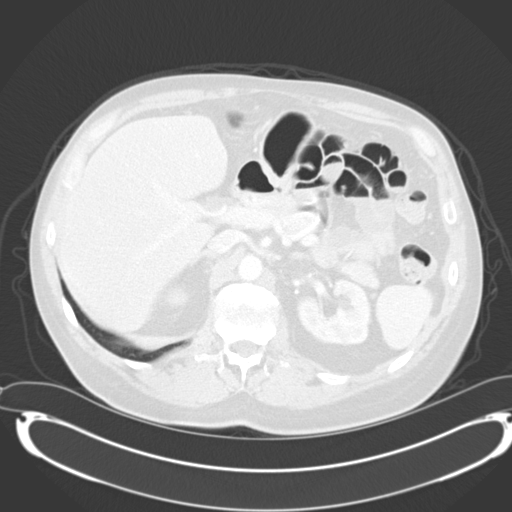
[im 10/63  lung]
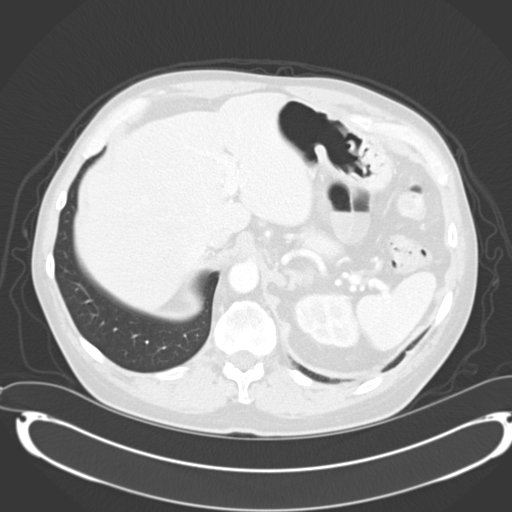
[im 13/63  lung]
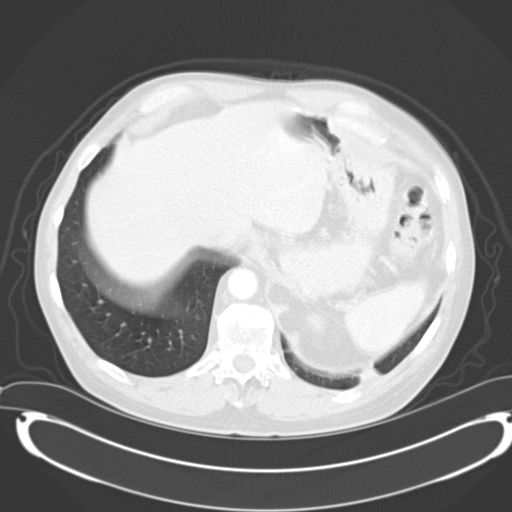
[im 17/63  lung]
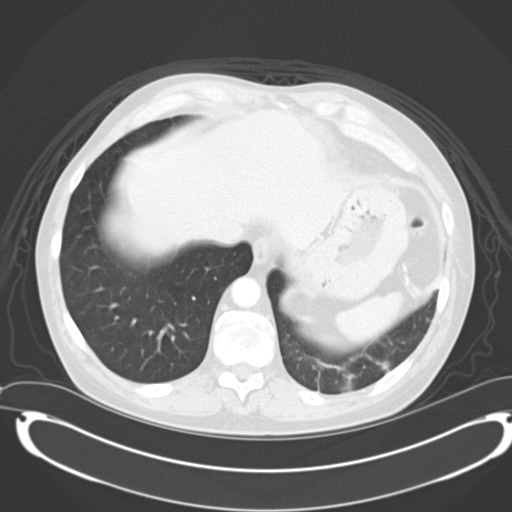
[im 21/63  mediastinal]
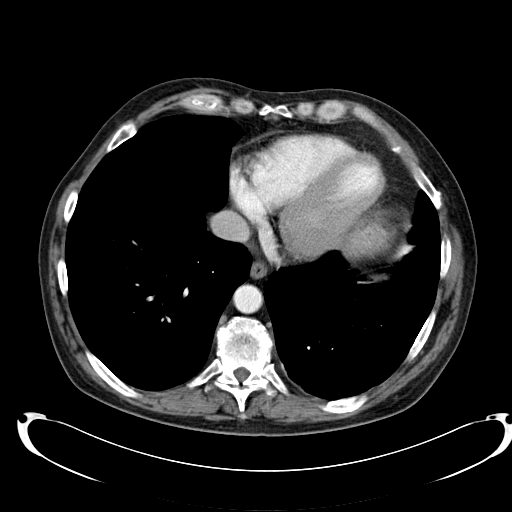
[im 21/63  lung]
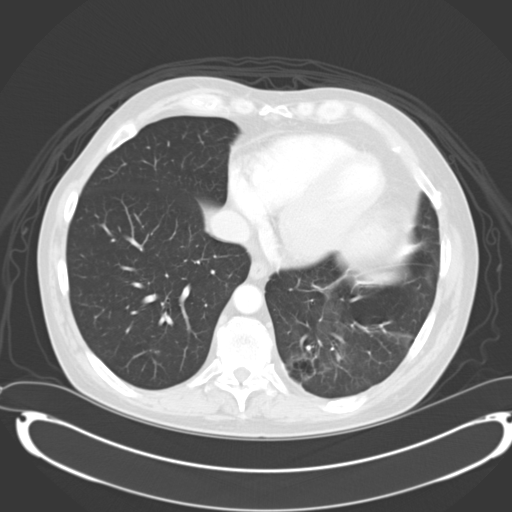
[im 25/63  lung]
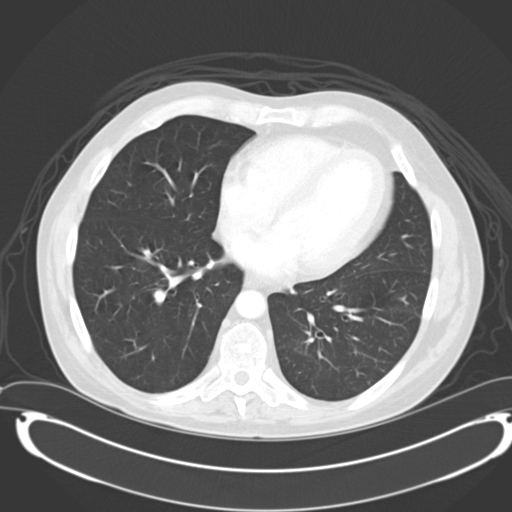
[im 28/63  lung]
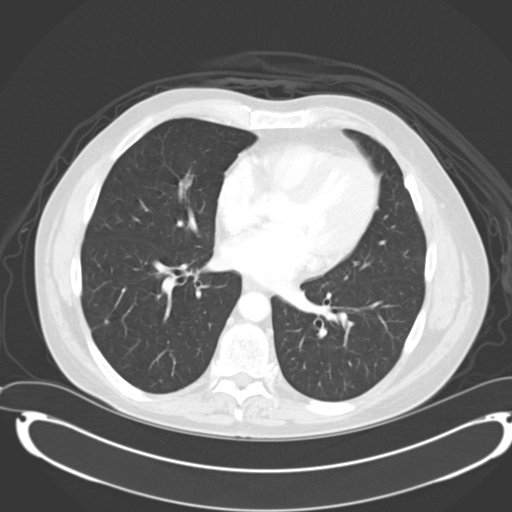
[im 33/63  lung]
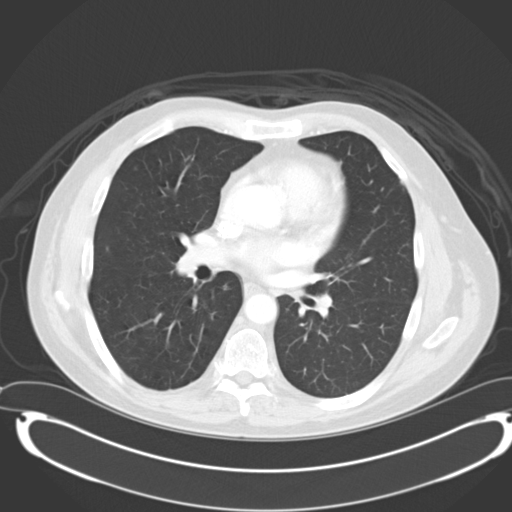
[im 35/63  mediastinal]
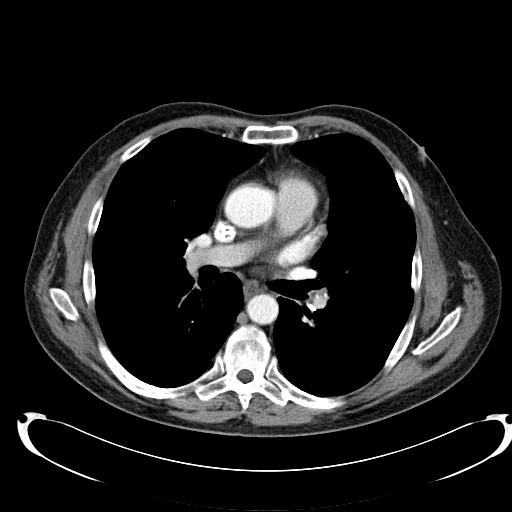
[im 35/63  lung]
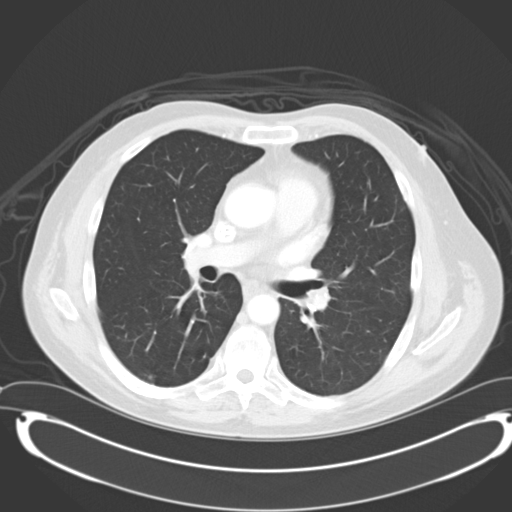
[im 38/63  lung]
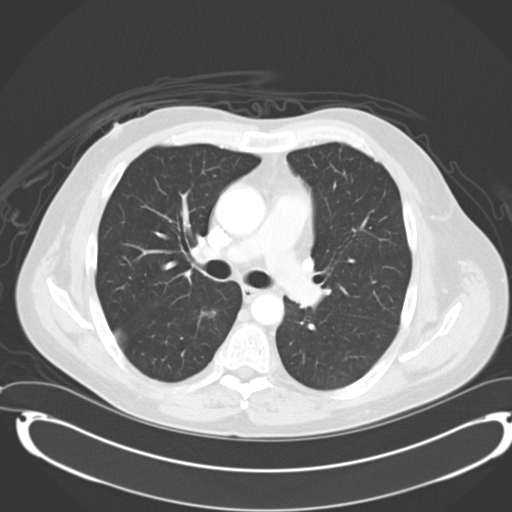
[im 42/63  lung]
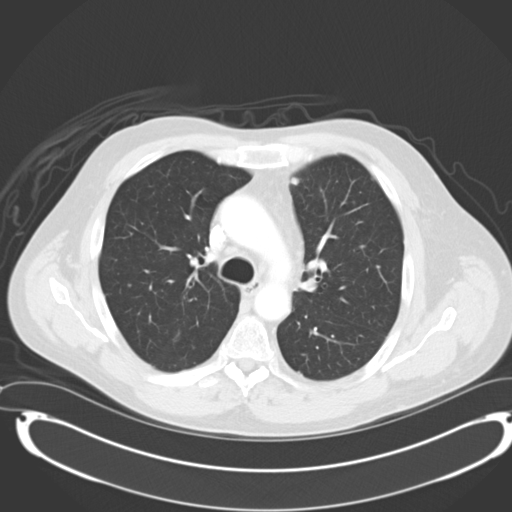
[im 46/63  lung]
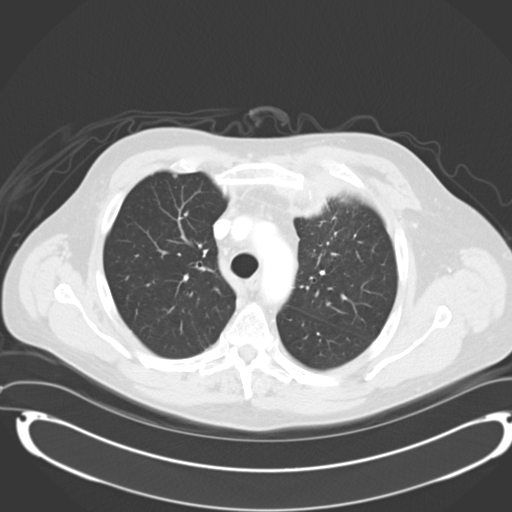
[im 50/63  mediastinal]
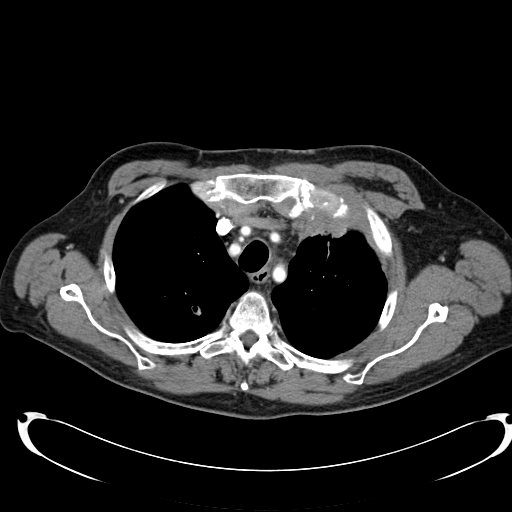
[im 50/63  lung]
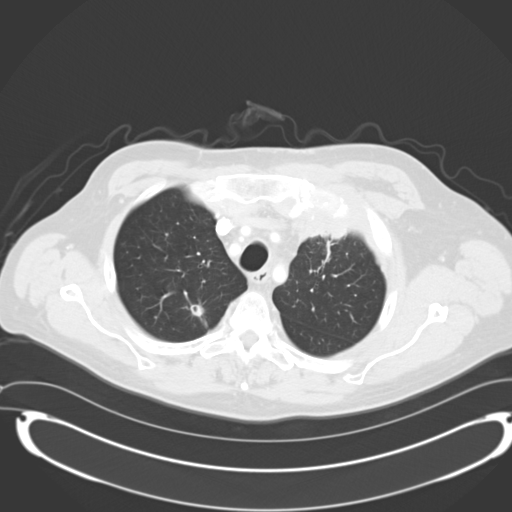
[im 53/63  lung]
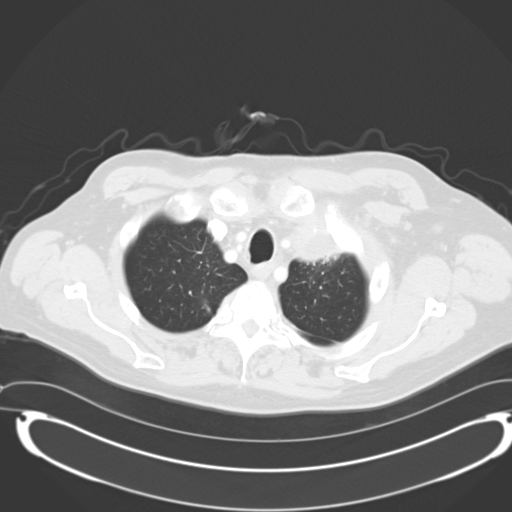
[im 58/63  lung]
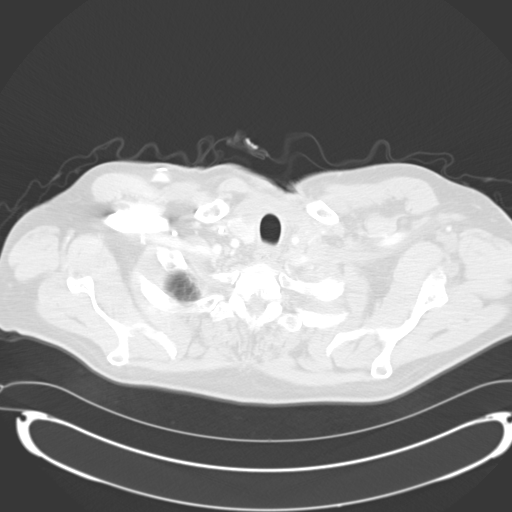

[15 of 33 positions shown; findings below may reference images not displayed]

FINDINGS: There is no pleural effusion identified.  Multi focal
pulmonary nodules are identified.  Right upper lobe partially
cavitary nodule measures 1.2 cm, image 14/series 5.  This is
compared with an 1.3 cm previously.

Nodule within superior segment of the right lower lobe measures 4
mm, image 27/series 5.  This is compared with 5 mm previously.
Right lower lobe nodule measures 5 mm, image 37/series 5.
Previously this measured the same.  New right lower lobe nodule is
identified measuring 5 mm, image 42/series 5.  Within the left
lower lobe there is a stable 8 mm nodule, image 42/series 5.  New
left upper lobe nodule measures 7 mm, image 22/series 5.  Also new
is a subpleural nodule on the left upper lobe measuring 8 mm, image
28/series 5.

Heart size is normal.  No pericardial effusion.  At the thoracic
inlet there is a tumor which is extending into the chest wall and
along the left internal mammary lymph node chain.  This measures
3.3 x 3.9 x 3.4 cm.

Limited imaging through the upper abdomen shows no acute findings.

There is multilevel spondylosis noted within the thoracic spine.
IMPRESSION: 1.  Interval progression of disease.  There is a soft tissue
attenuating mass at the thoracic inlet which is invading the chest
wall and internal mammary lymph node chain.  This is new from
previous exam.
2.  Multiple bilateral pulmonary nodules.  Previously demonstrated
nodules are not significantly changed from previous exam.  There
are new nodules however within the right lower lobe and left upper
lobe which are concerning for progression of disease.

## 2014-12-09 NOTE — Discharge Summary (Signed)
PATIENT NAME:  [name] [name], [name] [name] MR#:  [id number] DATE OF BIRTH:  1956-01-10  DATE OF ADMISSION:  01/06/2013 DATE OF DISCHARGE:  01/08/2013  DISCHARGE DIAGNOSES: 1.  Hyponatremia due to cancer, dehydration and alcohol.  2.  Throat cancer.  3.  Symptomatic anemia.  4.  Alcoholism.   CONDITION ON DISCHARGE: Stable.   CODE STATUS:  FULL CODE.     MEDICATIONS ON DISCHARGE: 1.  ProAir 2 puffs inhalation 4 times a day as needed for shortness of breath.  2.  Aspirin 81 mg oral tablet once a day.  3.  Vitamin D3 5000 international units once a day.  4.  Escitalopram 20 mg oral tablet once a day.  5.  Levothyroxine 50 mcg once a day.  6.  Losartan 100 mg once a day.  7.  Magnesium gluconate 500 mg oral tablet once a day.  8.  Omeprazole 40 mg delayed-release capsule once a day.  9.  Ondansetron 8 mg oral tablet every 8 hours as needed for nausea and vomiting.  10.  Risperidone 0.25 mg oral tablet once a day.  11.  Tamsulosin 0.4 mg oral capsule once a day.  12.  Prochlorperazine 10 mg oral tablet every 6 hours as needed for nausea and vomiting.   DIET ON DISCHARGE: Regular diet, consistency regular.   ACTIVITY LIMITATION: As tolerated.   TIMEFRAME TO FOLLOW UP: One to 2 weeks with primary care physician and oncologist in [place].   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 He had blood work drawn which was noted to have a sodium level of 116, and so he was sent in.  He also was noted to have anemia, and so he recommended. the patient to go to the Emergency Room for hospitalization.  The patient was a heavy drinker and drank about 9 to 12 beers a day, history of hyponatremia chronically and had antidiuretic hormone which was less than 1 in December 2013. Sodium is chronically low. Last month it was 120 when it was  checked. Three months ago, hemoglobin was 10.8, and in the Emergency Room hemoglobin was 7.5.  As per the wife, over the past few days the patient was very confused, progressively getting weak and tired and also noted some memory loss. No fever or chills. No chest pain, denied any blood in the stool.   HOSPITAL COURSE AND STAY:  1.  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 Nephrologist, Dr. Candiss Norse, saw the patient, and after some IV fluid his sodium level came up to satisfactory level as the patient had chronic hyponatremia and his sodium level was running 120 to 125 chronically.  He also advised him to follow in the clinic on a regular basis for this issue.  2.  Symptomatic anemia: The patient received transfusion, and he is also following with hematologist; so after transfusion  Hemoglobin remained stable, and we advised him to continue followup with his hematologist.  3.  Throat cancer with metastasis:  Needs to follow up with primary oncologist after discharge.  4.  Alcohol abuse:  He was on CIWA protocol and remained stable from alcohol withdrawal.  5.  Depression: We continued home medication regimen.  CONSULT IN THE HOSPITAL: Dr. Candiss Norse for nephrology.   LABORATORY DATA: On the 21st of May  on presentation, sodium was 117, creatinine was 0.88, chloride was 83. Hemoglobin was 7.5, platelet count was 201, and MCV was 89. Magnesium was 1.8, phosphorus 3.3. Ferritin was 176. Vitamin B12 was 465. Folic acid 89.3. Urinalysis was negative. On repeat, sodium level came to 121 and chloride came to 89. Hemoglobin after transfusion came to 8.7, and on the next day sodium level was 123 and chloride was 91.  Creatinine remained stable at 0.92. TSH was 5.42. Free thyroxine was 1.06, and on the day of discharge sodium was 124 and chloride was 89.       TOTAL TIME SPENT ON THIS DISCHARGE: 45 minutes.  ____________________________ Ceasar Lund  Anselm Jungling, MD vgv:cb D: 01/26/2013 13:59:57 ET T: 01/26/2013 14:21:09 ET JOB#: [id number]  cc: Ceasar Lund. Anselm Jungling, MD, <Dictator> Murlean Iba, MD Vaughan Basta MD ELECTRONICALLY SIGNED 02/01/2013 22:09

## 2014-12-09 NOTE — H&P (Signed)
PATIENT NAME:  [name] [name], [name] [name] MR#:  [id number] DATE OF BIRTH:  [birth year]  DATE OF ADMISSION:  01/06/2013  PRIMARY CARE PROVIDER: [facility].  EMERGENCY DEPARTMENT REFERRING PHYSICIAN: Jon Gills. Lord, MD  CHIEF COMPLAINT: Weakness, some confusion, hyponatremia, anemia.   HISTORY OF PRESENT ILLNESS: The patient is a 59 year old white male with throat cancer with  metastatic disease to the lung, who went to see his oncologist for generalized weakness, and has had some confusion according to his wife. He had blood work drawn and was noted to have a sodium of 116. He was also noted to be anemic. Therefore, with his confusion and electrolyte abnormality, he recommended the patient go to the ER for hospitalization. The patient  is a heavy drinker and dinks about 9 to 12 beers a day. He has a history of having hyponatremia chronically and has had ADH which was less than 1 in December 2013. His sodium is chronically low. When checked last month, it was 120. The patient also has chronic anemia. Three months ago, his hemoglobin was 10.8. In the ED here, his hemoglobin is noted to be 7.5. According to his wife, the patient over the past few days has been very dazed and he just looks into the space, confused. He also has had progressive weakness and tiredness. She reports that he also has had some short-term memory loss. He is able to ambulate but is very weak when he walks. He denies any fevers, chills. No chest pains. No palpitations. Denies any blood in the stool. Denies any hematemesis or hematochezia. Denies any fevers or chills.   PAST MEDICAL HISTORY:  1.  Hypothyroidism.  2.  Depression.  3.  Gastroesophageal reflux disease.  4.  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  5.  Possible COPD. He is on  inhalers. They are not sure about that. 6.  Has a history of hypertension.   PAST SURGICAL HISTORY: Status post right forearm surgery, history of resection of a lesion in  his throat, right tibial surgery, left femur surgery,  bilateral knee arthroscopy, has a  Port-A-Cath placement.   ALLERGIES: STATES THAT HE IS ONLY ALLERGIC TO IV BENADRYL, NOT ORAL.   CURRENT MEDICATIONS: He is on aspirin 81 mg 1 tab p.o. daily, Lexapro 20 daily, FlorEssence powder 30 grams 2 tbsp daily, levothyroxine 50 mcg daily, losartan 100 daily, magnesium gluconate 500 one tab p.o. daily, omeprazole 40 daily, Zofran 8 mg 1 tab p.o. q.8 p.r.n., ProAir 2 puffs 4 times per day as needed for shortness of breath, promethazine 10 mg q.6 p.r.n., risperidone 0.25  at bedtime, Flomax 0.4 daily, vitamin D3 at 5000 international units daily.   SOCIAL HISTORY: He used to smoke but quit. Drinks 9 to 12 beers a day. No drug use.   FAMILY HISTORY: Positive for hypertension.   REVIEW OF SYSTEMS:    CONSTITUTIONAL: Denies any fevers. Complains of fatigue, weakness. No pain. No weight loss. No weight gain.  EYES: No blurred or double vision. No pain. No redness. No inflammation. No glaucoma. No cataracts.  EARS, NOSE, THROAT: No tinnitus. No ear pain. No hearing loss. No seasonal or year-round allergies. No epistaxis. No nasal discharge. No postnasal drip. No difficulty swallowing.  RESPIRATORY: Denies any cough, wheezing. No hemoptysis. Has occasional dyspnea. No TB.  CARDIOVASCULAR: Denies any chest pain, orthopnea, edema or arrhythmia.  GASTROINTESTINAL: No nausea, vomiting, diarrhea. No abdominal pain. No hematemesis. No melena. No ulcer. No GERD. No IBS.  No jaundice. No rectal bleeding.  GENITOURINARY: Denies any dysuria, hematuria, renal calculus or frequency.  ENDOCRINE: Denies any polyuria, nocturia. Does have hypothyroidism.  HEMATOLOGIC AND LYMPHATIC: Denies anemia, easy bruisability or bleeding.  SKIN: No acne. No rash. No changes in mole, hair or skin.  MUSCULOSKELETAL: Denies any pain in  neck, back or shoulder. No gout.  NEUROLOGIC: Numbness. No CVA. No TIA. No seizures.  PSYCHIATRIC: No  anxiety. No insomnia. No ADD. No OCD.   PHYSICAL EXAMINATION: VITAL SIGNS: Temperature 98.4, pulse 77, respirations 16, blood pressure 145/74,  O2 sat 100%.  GENERAL: The patient is a well-developed Caucasian male in no acute distress.  HEENT: Head atraumatic, normocephalic. Pupils equally round, reactive to light and accommodation. There is no conjunctival pallor. No scleral icterus. Extraocular movements intact. Nose: There are no nasal lesions. No drainage. Ear exam externally shows no drainage or any external lesions. Mouth is moist without any exudate.  NECK: Supple and symmetric. No masses. Thyroid midline, nonenlarged. No JVD.  RESPIRATORY: There is no accessory muscle usage. Lungs clear to auscultation bilaterally without any rales, rhonchi, wheezing.  CARDIOVASCULAR: Regular rate and rhythm. No murmurs, clicks, gallops. PMI is not displaced.  GASTROINTESTINAL: Positive bowel sounds x 4. There is no hepatosplenomegaly. No tenderness. No guarding. No rebound.  GENITOURINARY: Deferred.  MUSCULOSKELETAL: There is normal gait. No erythema. No swelling.  SKIN: He has some postradiation changes in his neck, but otherwise no rash.  LYMPHATICS: No lymph nodes palpable.  VASCULAR: Good DP, PT pulses.  NEUROLOGICAL: The patient is awake. Cranial nerves II through XII grossly intact. Reflexes 2+. Strength is 5 out of 5 in all 4 extremities.  PSYCHIATRIC: Not anxious or depressed.   LABORATORY AND RADIOLOGICAL DATA: Urinalysis: Nitrites negative, leukocytes negative. Chest x-ray shows mild hyperinflation, no other abnormalities noted. CT scan of the head shows no acute abnormality, chronic small vessel changes. BMP: Glucose 89, BUN 13, creatinine 0.88, sodium 117, potassium 4.2, chloride  8.3; CO2 is 27; calcium is 8.5. LFTs are normal. WBC 6.1, hemoglobin 7.5, platelet count 201. Magnesium 1.8, phosphorus 3.3. Alcohol level was less than 0.003.   ASSESSMENT AND PLAN: The patient is a 59 year old  white male with history of metastatic throat cancer. Has been weak and a little confused lately. Is noted to have severe hyponatremia.  1.  Generalized weakness, likely due to progressive hyponatremia as well as worsening anemia. At this time, we will give him intravenous fluids with normal saline. Follow his sodium.  2.  Hyponatremia. Likely due to excessive beer ingestion and poor oral intake. The patient apparently has been evaluated for syndrome of inappropriate antidiuretic hormone secretion and his workup was negative. At this time, we will give him intravenous  fluids, check  serial sodiums. We will check urine osmolality and serum osmolality. We will ask nephrology to evaluate the patient.  3.  Symptomatic anemia. We will guaiac his stool. We will check iron levels, ferritin levels, B12, folate levels. Due to having symptoms from it (being weak, confused), we will  transfuse him packed red blood cells, 1 unit. The patient will need further gastrointestinal  evaluation based on his hemoglobin. This likely can be done as an outpatient. The patient consents to transfusion. Risks and benefits of transfusions are explained.  4.  Throat cancer with metastasis. He needs to follow up with his primary oncologist upon discharge.  5.  Alcohol abuse. We will place him on [facility] Withdrawal [facility] protocol.  6.  Depression: Continue his  home regimen.  7.  Miscellaneous: Will place him on sequential compression devices for deep vein thrombosis prophylaxis.   NOTE: 50 minutes spent on H and P.    ____________________________ Lafonda Mosses. Posey Pronto, MD shp:jm D: 01/06/2013 17:14:59 ET T: 01/06/2013 17:49:13 ET JOB#: [id number]  cc: Darrel Baroni H. Posey Pronto, MD, <Dictator> Alric Seton MD ELECTRONICALLY SIGNED 01/14/2013 14:49
